# Patient Record
Sex: Female | Born: 1945
Health system: Southern US, Community
[De-identification: ages and names within clinical notes are randomized; demographics above are authoritative.]

## PROBLEM LIST (undated history)

## (undated) DIAGNOSIS — M8580 Other specified disorders of bone density and structure, unspecified site: Secondary | ICD-10-CM

## (undated) DIAGNOSIS — T7840XA Allergy, unspecified, initial encounter: Secondary | ICD-10-CM

## (undated) DIAGNOSIS — M1990 Unspecified osteoarthritis, unspecified site: Secondary | ICD-10-CM

## (undated) DIAGNOSIS — N952 Postmenopausal atrophic vaginitis: Secondary | ICD-10-CM

## (undated) DIAGNOSIS — D649 Anemia, unspecified: Secondary | ICD-10-CM

## (undated) HISTORY — DX: Anemia, unspecified: D64.9

## (undated) HISTORY — PX: TONSILLECTOMY AND ADENOIDECTOMY: SHX28

## (undated) HISTORY — PX: POLYPECTOMY: SHX149

## (undated) HISTORY — PX: BREAST SURGERY: SHX581

## (undated) HISTORY — DX: Other specified disorders of bone density and structure, unspecified site: M85.80

## (undated) HISTORY — PX: COLONOSCOPY: SHX174

## (undated) HISTORY — DX: Postmenopausal atrophic vaginitis: N95.2

## (undated) HISTORY — PX: WISDOM TOOTH EXTRACTION: SHX21

## (undated) HISTORY — DX: Allergy, unspecified, initial encounter: T78.40XA

## (undated) HISTORY — DX: Unspecified osteoarthritis, unspecified site: M19.90

---

## 1998-04-23 ENCOUNTER — Other Ambulatory Visit: Admission: RE | Admit: 1998-04-23 | Discharge: 1998-04-23 | Payer: Self-pay | Admitting: Obstetrics and Gynecology

## 1998-08-11 ENCOUNTER — Other Ambulatory Visit: Admission: RE | Admit: 1998-08-11 | Discharge: 1998-08-11 | Payer: Self-pay | Admitting: Obstetrics and Gynecology

## 1999-03-05 ENCOUNTER — Other Ambulatory Visit: Admission: RE | Admit: 1999-03-05 | Discharge: 1999-03-05 | Payer: Self-pay | Admitting: Obstetrics and Gynecology

## 2000-03-04 ENCOUNTER — Other Ambulatory Visit: Admission: RE | Admit: 2000-03-04 | Discharge: 2000-03-04 | Payer: Self-pay | Admitting: Obstetrics and Gynecology

## 2000-03-04 ENCOUNTER — Encounter (INDEPENDENT_AMBULATORY_CARE_PROVIDER_SITE_OTHER): Payer: Self-pay

## 2000-04-27 ENCOUNTER — Encounter: Payer: Self-pay | Admitting: Obstetrics and Gynecology

## 2000-04-27 ENCOUNTER — Encounter: Admission: RE | Admit: 2000-04-27 | Discharge: 2000-04-27 | Payer: Self-pay | Admitting: Obstetrics and Gynecology

## 2001-03-28 ENCOUNTER — Other Ambulatory Visit: Admission: RE | Admit: 2001-03-28 | Discharge: 2001-03-28 | Payer: Self-pay | Admitting: Obstetrics and Gynecology

## 2001-05-10 ENCOUNTER — Encounter: Admission: RE | Admit: 2001-05-10 | Discharge: 2001-05-10 | Payer: Self-pay

## 2001-05-10 ENCOUNTER — Encounter: Payer: Self-pay | Admitting: Obstetrics and Gynecology

## 2002-04-02 ENCOUNTER — Other Ambulatory Visit: Admission: RE | Admit: 2002-04-02 | Discharge: 2002-04-02 | Payer: Self-pay | Admitting: Obstetrics and Gynecology

## 2002-05-15 ENCOUNTER — Encounter: Admission: RE | Admit: 2002-05-15 | Discharge: 2002-05-15 | Payer: Self-pay | Admitting: Obstetrics and Gynecology

## 2002-05-15 ENCOUNTER — Encounter: Payer: Self-pay | Admitting: Obstetrics and Gynecology

## 2003-04-03 ENCOUNTER — Other Ambulatory Visit: Admission: RE | Admit: 2003-04-03 | Discharge: 2003-04-03 | Payer: Self-pay | Admitting: Obstetrics and Gynecology

## 2003-05-29 ENCOUNTER — Encounter: Admission: RE | Admit: 2003-05-29 | Discharge: 2003-05-29 | Payer: Self-pay | Admitting: Obstetrics and Gynecology

## 2003-05-29 ENCOUNTER — Encounter: Payer: Self-pay | Admitting: Obstetrics and Gynecology

## 2004-04-02 ENCOUNTER — Other Ambulatory Visit: Admission: RE | Admit: 2004-04-02 | Discharge: 2004-04-02 | Payer: Self-pay | Admitting: Obstetrics and Gynecology

## 2004-05-29 ENCOUNTER — Encounter: Admission: RE | Admit: 2004-05-29 | Discharge: 2004-05-29 | Payer: Self-pay | Admitting: Obstetrics and Gynecology

## 2005-04-06 ENCOUNTER — Other Ambulatory Visit: Admission: RE | Admit: 2005-04-06 | Discharge: 2005-04-06 | Payer: Self-pay | Admitting: Obstetrics and Gynecology

## 2005-06-07 ENCOUNTER — Encounter: Admission: RE | Admit: 2005-06-07 | Discharge: 2005-06-07 | Payer: Self-pay | Admitting: Obstetrics and Gynecology

## 2006-04-07 ENCOUNTER — Other Ambulatory Visit: Admission: RE | Admit: 2006-04-07 | Discharge: 2006-04-07 | Payer: Self-pay | Admitting: Obstetrics and Gynecology

## 2006-06-08 ENCOUNTER — Encounter: Admission: RE | Admit: 2006-06-08 | Discharge: 2006-06-08 | Payer: Self-pay | Admitting: Obstetrics and Gynecology

## 2007-04-25 ENCOUNTER — Other Ambulatory Visit: Admission: RE | Admit: 2007-04-25 | Discharge: 2007-04-25 | Payer: Self-pay | Admitting: Obstetrics and Gynecology

## 2007-06-20 ENCOUNTER — Encounter: Admission: RE | Admit: 2007-06-20 | Discharge: 2007-06-20 | Payer: Self-pay | Admitting: Obstetrics and Gynecology

## 2008-04-30 ENCOUNTER — Other Ambulatory Visit: Admission: RE | Admit: 2008-04-30 | Discharge: 2008-04-30 | Payer: Self-pay | Admitting: Obstetrics and Gynecology

## 2008-06-20 ENCOUNTER — Encounter: Admission: RE | Admit: 2008-06-20 | Discharge: 2008-06-20 | Payer: Self-pay | Admitting: Obstetrics and Gynecology

## 2009-05-01 ENCOUNTER — Encounter: Payer: Self-pay | Admitting: Obstetrics and Gynecology

## 2009-05-01 ENCOUNTER — Other Ambulatory Visit: Admission: RE | Admit: 2009-05-01 | Discharge: 2009-05-01 | Payer: Self-pay | Admitting: Obstetrics and Gynecology

## 2009-05-01 ENCOUNTER — Ambulatory Visit: Payer: Self-pay | Admitting: Obstetrics and Gynecology

## 2009-07-02 ENCOUNTER — Encounter: Admission: RE | Admit: 2009-07-02 | Discharge: 2009-07-02 | Payer: Self-pay | Admitting: Internal Medicine

## 2010-05-05 ENCOUNTER — Ambulatory Visit: Payer: Self-pay | Admitting: Obstetrics and Gynecology

## 2010-05-05 ENCOUNTER — Other Ambulatory Visit: Admission: RE | Admit: 2010-05-05 | Discharge: 2010-05-05 | Payer: Self-pay | Admitting: Obstetrics and Gynecology

## 2010-07-08 ENCOUNTER — Encounter: Admission: RE | Admit: 2010-07-08 | Discharge: 2010-07-08 | Payer: Self-pay | Admitting: Obstetrics and Gynecology

## 2010-07-21 ENCOUNTER — Encounter (INDEPENDENT_AMBULATORY_CARE_PROVIDER_SITE_OTHER): Payer: Self-pay | Admitting: *Deleted

## 2011-01-14 NOTE — Letter (Signed)
Summary: Colonoscopy Letter  [facility]  [address] [place], [place] [address]   Phone: [phone]  Fax: [phone]      July 21, 2010 MRN: [id number]   [address] [place], [place]  [address]   Dear Ms. [name],   According to your medical record, it is time for you to schedule a Colonoscopy. The American Cancer Society recommends this procedure as a method to detect early colon cancer. Patients with a family history of colon cancer, or a personal history of colon polyps or inflammatory bowel disease are at increased risk.  This letter has beeen generated based on the recommendations made at the time of your procedure. If you feel that in your particular situation this Marcos no longer apply, please contact our office.  Please call our office at [phone] to schedule this appointment or to update your records at your earliest convenience.  Thank you for cooperating with [place] to provide you with the very best care possible.   Sincerely,  Hedwig Morton. Juanda Chance, M.D.  [facility]ology Division [phone]

## 2011-04-07 ENCOUNTER — Other Ambulatory Visit: Payer: Self-pay | Admitting: Dermatology

## 2011-05-07 ENCOUNTER — Encounter (INDEPENDENT_AMBULATORY_CARE_PROVIDER_SITE_OTHER): Payer: Medicare Other | Admitting: Obstetrics and Gynecology

## 2011-05-07 DIAGNOSIS — N951 Menopausal and female climacteric states: Secondary | ICD-10-CM

## 2011-05-07 DIAGNOSIS — N952 Postmenopausal atrophic vaginitis: Secondary | ICD-10-CM

## 2011-05-07 DIAGNOSIS — M899 Disorder of bone, unspecified: Secondary | ICD-10-CM

## 2011-06-01 ENCOUNTER — Other Ambulatory Visit: Payer: Self-pay | Admitting: Obstetrics and Gynecology

## 2011-06-01 DIAGNOSIS — Z1231 Encounter for screening mammogram for malignant neoplasm of breast: Secondary | ICD-10-CM

## 2011-07-27 ENCOUNTER — Ambulatory Visit
Admission: RE | Admit: 2011-07-27 | Discharge: 2011-07-27 | Disposition: A | Discharge status: Home / Self Care | Payer: Medicare Other | Source: Ambulatory Visit | Attending: Obstetrics and Gynecology | Admitting: Obstetrics and Gynecology

## 2011-07-27 DIAGNOSIS — Z1231 Encounter for screening mammogram for malignant neoplasm of breast: Secondary | ICD-10-CM

## 2011-12-28 DIAGNOSIS — Z23 Encounter for immunization: Secondary | ICD-10-CM | POA: Diagnosis not present

## 2011-12-30 DIAGNOSIS — R29898 Other symptoms and signs involving the musculoskeletal system: Secondary | ICD-10-CM | POA: Diagnosis not present

## 2011-12-30 DIAGNOSIS — M25569 Pain in unspecified knee: Secondary | ICD-10-CM | POA: Diagnosis not present

## 2012-01-08 DIAGNOSIS — L0202 Furuncle of face: Secondary | ICD-10-CM | POA: Diagnosis not present

## 2012-01-08 DIAGNOSIS — L0203 Carbuncle of face: Secondary | ICD-10-CM | POA: Diagnosis not present

## 2012-01-26 DIAGNOSIS — H6120 Impacted cerumen, unspecified ear: Secondary | ICD-10-CM | POA: Diagnosis not present

## 2012-06-06 ENCOUNTER — Other Ambulatory Visit: Payer: Self-pay | Admitting: Obstetrics and Gynecology

## 2012-06-07 ENCOUNTER — Other Ambulatory Visit: Payer: Self-pay | Admitting: Obstetrics and Gynecology

## 2012-06-07 DIAGNOSIS — Z1231 Encounter for screening mammogram for malignant neoplasm of breast: Secondary | ICD-10-CM

## 2012-06-19 ENCOUNTER — Encounter: Payer: Self-pay | Admitting: Obstetrics and Gynecology

## 2012-06-19 ENCOUNTER — Ambulatory Visit (INDEPENDENT_AMBULATORY_CARE_PROVIDER_SITE_OTHER): Payer: Medicare Other | Admitting: Obstetrics and Gynecology

## 2012-06-19 VITALS — BP 116/74 | Ht 64.5 in | Wt 102.0 lb

## 2012-06-19 DIAGNOSIS — N952 Postmenopausal atrophic vaginitis: Secondary | ICD-10-CM | POA: Insufficient documentation

## 2012-06-19 DIAGNOSIS — Z780 Asymptomatic menopausal state: Secondary | ICD-10-CM

## 2012-06-19 DIAGNOSIS — M8580 Other specified disorders of bone density and structure, unspecified site: Secondary | ICD-10-CM | POA: Insufficient documentation

## 2012-06-19 DIAGNOSIS — M899 Disorder of bone, unspecified: Secondary | ICD-10-CM

## 2012-06-19 MED ORDER — RALOXIFENE HCL 60 MG PO TABS: 60.0000 mg | ORAL_TABLET | Freq: Every day | ORAL | Status: AC

## 2012-06-19 MED ORDER — ESTRADIOL 10 MCG VA TABS: ORAL_TABLET | VAGINAL | Status: DC

## 2012-06-19 NOTE — Progress Notes (Signed)
Patient came to see me today for followup. We have her on Evista both for bone protection with low bone mass and breast cancer protection with the mother who had breast cancer. She does well on Evista without side effects. She also takes Vagifem for vaginal dryness with good results when she remembers it. Her last bone density which is done by her PCP showed improvement. She is having no vaginal bleeding. She is having no pelvic pain. She is not having dysuria, urgency, frequency or incontinence of urination. She will have occasional menopausal symptoms which are very tolerable. She has never had an abnormal Pap smear due  to cervical dysplasia. Her last Pap smear was [place]frane, 2011.  ROS: 12 system review done. Pertinent positives above.  Physical examination:Kim Julian Reil present.HEENT within normal limits. Neck: Thyroid not large. No masses. Supraclavicular nodes: not enlarged. Breasts: Examined in both sitting and lying  position. No skin changes and no masses. Abdomen: Soft no guarding rebound or masses or hernia. Pelvic: External: Within normal limits. BUS: Within normal limits. Vaginal:within normal limits. Good estrogen effect. No evidence of cystocele rectocele or enterocele. Cervix: clean. Uterus: Normal size and shape. Adnexa: No masses. Rectovaginal exam: Confirmatory and negative. Extremities: Within normal limits.  Assessment: #1. Osteopenia #2. Atrophic vaginitis #3. Mild menopausal symptoms #4. Mother with breast cancer  Plan: Continue yearly mammograms. Continue Evista. Continue Vagifem.The new Pap smear guidelines were discussed with the patient. No pap done.

## 2012-07-17 DIAGNOSIS — E079 Disorder of thyroid, unspecified: Secondary | ICD-10-CM | POA: Diagnosis not present

## 2012-07-17 DIAGNOSIS — M949 Disorder of cartilage, unspecified: Secondary | ICD-10-CM | POA: Diagnosis not present

## 2012-07-17 DIAGNOSIS — M899 Disorder of bone, unspecified: Secondary | ICD-10-CM | POA: Diagnosis not present

## 2012-07-17 DIAGNOSIS — R7301 Impaired fasting glucose: Secondary | ICD-10-CM | POA: Diagnosis not present

## 2012-07-20 ENCOUNTER — Encounter: Payer: Medicare Other | Admitting: Obstetrics and Gynecology

## 2012-07-21 DIAGNOSIS — R7301 Impaired fasting glucose: Secondary | ICD-10-CM | POA: Diagnosis not present

## 2012-07-21 DIAGNOSIS — M899 Disorder of bone, unspecified: Secondary | ICD-10-CM | POA: Diagnosis not present

## 2012-07-21 DIAGNOSIS — E079 Disorder of thyroid, unspecified: Secondary | ICD-10-CM | POA: Diagnosis not present

## 2012-07-21 DIAGNOSIS — M949 Disorder of cartilage, unspecified: Secondary | ICD-10-CM | POA: Diagnosis not present

## 2012-07-21 DIAGNOSIS — Z0000 Encounter for general adult medical examination without abnormal findings: Secondary | ICD-10-CM | POA: Diagnosis not present

## 2012-08-03 ENCOUNTER — Ambulatory Visit
Admission: RE | Admit: 2012-08-03 | Discharge: 2012-08-03 | Disposition: A | Discharge status: Home / Self Care | Payer: Medicare Other | Source: Ambulatory Visit | Attending: Obstetrics and Gynecology | Admitting: Obstetrics and Gynecology

## 2012-08-03 DIAGNOSIS — Z1231 Encounter for screening mammogram for malignant neoplasm of breast: Secondary | ICD-10-CM

## 2012-08-08 ENCOUNTER — Ambulatory Visit: Payer: Medicare Other

## 2012-08-15 ENCOUNTER — Ambulatory Visit: Payer: Medicare Other

## 2012-08-15 ENCOUNTER — Encounter: Payer: Self-pay | Admitting: Internal Medicine

## 2012-10-10 DIAGNOSIS — Z23 Encounter for immunization: Secondary | ICD-10-CM | POA: Diagnosis not present

## 2012-12-22 ENCOUNTER — Other Ambulatory Visit: Payer: Self-pay | Admitting: Dermatology

## 2012-12-22 DIAGNOSIS — D485 Neoplasm of uncertain behavior of skin: Secondary | ICD-10-CM | POA: Diagnosis not present

## 2012-12-22 DIAGNOSIS — D1801 Hemangioma of skin and subcutaneous tissue: Secondary | ICD-10-CM | POA: Diagnosis not present

## 2012-12-22 DIAGNOSIS — C44711 Basal cell carcinoma of skin of unspecified lower limb, including hip: Secondary | ICD-10-CM | POA: Diagnosis not present

## 2012-12-22 DIAGNOSIS — Z85828 Personal history of other malignant neoplasm of skin: Secondary | ICD-10-CM | POA: Diagnosis not present

## 2012-12-22 DIAGNOSIS — L820 Inflamed seborrheic keratosis: Secondary | ICD-10-CM | POA: Diagnosis not present

## 2012-12-22 DIAGNOSIS — L819 Disorder of pigmentation, unspecified: Secondary | ICD-10-CM | POA: Diagnosis not present

## 2012-12-22 DIAGNOSIS — L570 Actinic keratosis: Secondary | ICD-10-CM | POA: Diagnosis not present

## 2012-12-22 DIAGNOSIS — L821 Other seborrheic keratosis: Secondary | ICD-10-CM | POA: Diagnosis not present

## 2013-01-15 DIAGNOSIS — E079 Disorder of thyroid, unspecified: Secondary | ICD-10-CM | POA: Diagnosis not present

## 2013-05-16 DIAGNOSIS — N3090 Cystitis, unspecified without hematuria: Secondary | ICD-10-CM | POA: Diagnosis not present

## 2013-05-16 DIAGNOSIS — R82998 Other abnormal findings in urine: Secondary | ICD-10-CM | POA: Diagnosis not present

## 2013-06-25 ENCOUNTER — Other Ambulatory Visit: Payer: Self-pay

## 2013-06-25 DIAGNOSIS — Z1231 Encounter for screening mammogram for malignant neoplasm of breast: Secondary | ICD-10-CM

## 2013-06-29 DIAGNOSIS — Z01419 Encounter for gynecological examination (general) (routine) without abnormal findings: Secondary | ICD-10-CM | POA: Diagnosis not present

## 2013-06-29 DIAGNOSIS — Z124 Encounter for screening for malignant neoplasm of cervix: Secondary | ICD-10-CM | POA: Diagnosis not present

## 2013-08-29 ENCOUNTER — Ambulatory Visit
Admission: RE | Admit: 2013-08-29 | Discharge: 2013-08-29 | Disposition: A | Discharge status: Home / Self Care | Payer: Medicare Other | Source: Ambulatory Visit

## 2013-08-29 DIAGNOSIS — Z1231 Encounter for screening mammogram for malignant neoplasm of breast: Secondary | ICD-10-CM

## 2013-09-03 ENCOUNTER — Other Ambulatory Visit: Payer: Self-pay | Admitting: Internal Medicine

## 2013-09-03 DIAGNOSIS — R928 Other abnormal and inconclusive findings on diagnostic imaging of breast: Secondary | ICD-10-CM

## 2013-09-05 DIAGNOSIS — Z79899 Other long term (current) drug therapy: Secondary | ICD-10-CM | POA: Diagnosis not present

## 2013-09-05 DIAGNOSIS — R7301 Impaired fasting glucose: Secondary | ICD-10-CM | POA: Diagnosis not present

## 2013-09-05 DIAGNOSIS — M899 Disorder of bone, unspecified: Secondary | ICD-10-CM | POA: Diagnosis not present

## 2013-09-05 DIAGNOSIS — E079 Disorder of thyroid, unspecified: Secondary | ICD-10-CM | POA: Diagnosis not present

## 2013-09-06 DIAGNOSIS — Z23 Encounter for immunization: Secondary | ICD-10-CM | POA: Diagnosis not present

## 2013-09-12 DIAGNOSIS — R7301 Impaired fasting glucose: Secondary | ICD-10-CM | POA: Diagnosis not present

## 2013-09-12 DIAGNOSIS — Z23 Encounter for immunization: Secondary | ICD-10-CM | POA: Diagnosis not present

## 2013-09-12 DIAGNOSIS — Z1331 Encounter for screening for depression: Secondary | ICD-10-CM | POA: Diagnosis not present

## 2013-09-12 DIAGNOSIS — Z681 Body mass index (BMI) 19 or less, adult: Secondary | ICD-10-CM | POA: Diagnosis not present

## 2013-09-12 DIAGNOSIS — Z0000 Encounter for general adult medical examination without abnormal findings: Secondary | ICD-10-CM | POA: Diagnosis not present

## 2013-09-12 DIAGNOSIS — M899 Disorder of bone, unspecified: Secondary | ICD-10-CM | POA: Diagnosis not present

## 2013-09-12 DIAGNOSIS — E079 Disorder of thyroid, unspecified: Secondary | ICD-10-CM | POA: Diagnosis not present

## 2013-09-19 ENCOUNTER — Ambulatory Visit
Admission: RE | Admit: 2013-09-19 | Discharge: 2013-09-19 | Disposition: A | Discharge status: Home / Self Care | Payer: Medicare Other | Source: Ambulatory Visit | Attending: Internal Medicine | Admitting: Internal Medicine

## 2013-09-19 ENCOUNTER — Other Ambulatory Visit: Payer: Self-pay | Admitting: Internal Medicine

## 2013-09-19 DIAGNOSIS — R928 Other abnormal and inconclusive findings on diagnostic imaging of breast: Secondary | ICD-10-CM

## 2013-10-09 ENCOUNTER — Encounter: Payer: Self-pay | Admitting: Internal Medicine

## 2013-10-10 DIAGNOSIS — M899 Disorder of bone, unspecified: Secondary | ICD-10-CM | POA: Diagnosis not present

## 2013-12-17 ENCOUNTER — Ambulatory Visit (AMBULATORY_SURGERY_CENTER): Payer: Self-pay | Admitting: *Deleted

## 2013-12-17 VITALS — Ht 64.5 in | Wt 107.8 lb

## 2013-12-17 DIAGNOSIS — Z860100 Personal history of colon polyps, unspecified: Secondary | ICD-10-CM

## 2013-12-17 MED ORDER — MOVIPREP 100 G PO SOLR: 1.0000 | Freq: Once | ORAL | Status: DC

## 2013-12-17 NOTE — Progress Notes (Signed)
No egg or soy allergy. No anesthesia problems.  

## 2013-12-25 ENCOUNTER — Ambulatory Visit (AMBULATORY_SURGERY_CENTER): Payer: Medicare Other | Admitting: Internal Medicine

## 2013-12-25 ENCOUNTER — Encounter: Payer: Self-pay | Admitting: Internal Medicine

## 2013-12-25 VITALS — BP 123/67 | HR 50 | Temp 96.8°F | Resp 33 | Ht 64.0 in | Wt 107.0 lb

## 2013-12-25 DIAGNOSIS — F411 Generalized anxiety disorder: Secondary | ICD-10-CM | POA: Diagnosis not present

## 2013-12-25 DIAGNOSIS — D126 Benign neoplasm of colon, unspecified: Secondary | ICD-10-CM

## 2013-12-25 DIAGNOSIS — Z86010 Personal history of colonic polyps: Secondary | ICD-10-CM

## 2013-12-25 DIAGNOSIS — Z1211 Encounter for screening for malignant neoplasm of colon: Secondary | ICD-10-CM

## 2013-12-25 MED ORDER — SODIUM CHLORIDE 0.9 % IV SOLN: 500.0000 mL | INTRAVENOUS | Status: DC

## 2013-12-25 NOTE — Op Note (Signed)
[place]  [address] [place], [address]   COLONOSCOPY PROCEDURE REPORT  PATIENT: [name], Madison M.  MR#: [id number] BIRTHDATE: [birth year] , 65  yrs. old GENDER: Female ENDOSCOPIST: Lafayette Dragon, MD REFERRED EN:IDPO Perini, M.D. PROCEDURE DATE:  12/25/2013 PROCEDURE:   Colonoscopy with cold biopsy polypectomy First Screening Colonoscopy - Avg.  risk and is 50 yrs.  old or older - No.  Prior Negative Screening - Now for repeat screening. N/A  History of Adenoma - Now for follow-up colonoscopy & has been > or = to 3 yrs.  N/A  Polyps Removed Today? Yes. ASA CLASS:   Class II INDICATIONS :hyper plastic polyp x2 on last colonoscopy in September 2004. MEDICATIONS: MAC sedation, administered by CRNA and propofol (Diprivan) 350mg  IV  DESCRIPTION OF PROCEDURE:   After the risks benefits and alternatives of the procedure were thoroughly explained, informed consent was obtained.  A digital rectal exam revealed no abnormalities of the rectum.   The LB PCF Q180 [id number]  endoscope was introduced through the anus and advanced to the cecum, which was identified by both the appendix and ileocecal valve. No adverse events experienced.   The quality of the prep was excellent, using MoviPrep  The instrument was then slowly withdrawn as the colon was fully examined.      COLON FINDINGS: A smooth sessile polyp ranging between 3-59mm in size was found in the ascending colon at 90 cm another  small polyp vs fold was located in the vicinity, and was removed with cold biopsies.   A polypectomy was performed with cold forceps.  The resection was complete and the polyp tissue was completely retrieved. Colon was tortuous Retroflexed views revealed no abnormalities. The time to cecum=22 minutes 14 seconds.  Withdrawal time=6 minutes 05 seconds.  The scope was withdrawn and the procedure completed. COMPLICATIONS: There were no complications.  ENDOSCOPIC IMPRESSION: Sessile polyp  ranging between 3-22mm in size was found in the ascending colon;at 90 cm, polypectomy was performed with cold forceps another small polyp versus fold was removed from 90 cm tortuous colon  RECOMMENDATIONS: 1.  Await biopsy results 2.  high fiber diet Equal colonoscopy pending pathology   eSigned:  Lafayette Dragon, MD 12/25/2013 10:32 AM   cc:   PATIENT NAME:  [name], Madison M. MR#: [id number]

## 2013-12-25 NOTE — Patient Instructions (Signed)
YOU HAD AN ENDOSCOPIC PROCEDURE TODAY AT THE [facility]: Refer to the procedure report that was given to you for any specific questions about what was found during the examination.  If the procedure report does not answer your questions, please call your gastroenterologist to clarify.  If you requested that your care partner not be given the details of your procedure findings, then the procedure report has been included in a sealed envelope for you to review at your convenience later.  YOU SHOULD EXPECT: Some feelings of bloating in the abdomen. Passage of more gas than usual.  Walking can help get rid of the air that was put into your GI tract during the procedure and reduce the bloating. If you had a lower endoscopy (such as a colonoscopy or flexible sigmoidoscopy) you Crevier notice spotting of blood in your stool or on the toilet paper. If you underwent a bowel prep for your procedure, then you Jacquez not have a normal bowel movement for a few days.  DIET: Your first meal following the procedure should be a light meal and then it is ok to progress to your normal diet.  A half-sandwich or bowl of soup is an example of a good first meal.  Heavy or fried foods are harder to digest and Haskew make you feel nauseous or bloated.  Likewise meals heavy in dairy and vegetables can cause extra gas to form and this can also increase the bloating.  Drink plenty of fluids but you should avoid alcoholic beverages for 24 hours.  ACTIVITY: Your care partner should take you home directly after the procedure.  You should plan to take it easy, moving slowly for the rest of the day.  You can resume normal activity the day after the procedure however you should NOT DRIVE or use heavy machinery for 24 hours (because of the sedation medicines used during the test).    SYMPTOMS TO REPORT IMMEDIATELY: A gastroenterologist can be reached at any hour.  During normal business hours, 8:30 AM to 5:00 PM Monday through Friday,  call [phone].  After hours and on weekends, please call the GI answering service at [phone] who will take a message and have the physician on call contact you.   Following lower endoscopy (colonoscopy or flexible sigmoidoscopy):  Excessive amounts of blood in the stool  Significant tenderness or worsening of abdominal pains  Swelling of the abdomen that is new, acute  Fever of 100F or higher  FOLLOW UP: If any biopsies were taken you will be contacted by phone or by letter within the next 1-3 weeks.  Call your gastroenterologist if you have not heard about the biopsies in 3 weeks.  Our staff will call the home number listed on your records the next business day following your procedure to check on you and address any questions or concerns that you Mederos have at that time regarding the information given to you following your procedure. This is a courtesy call and so if there is no answer at the home number and we have not heard from you through the emergency physician on call, we will assume that you have returned to your regular daily activities without incident.  SIGNATURES/CONFIDENTIALITY: You and/or your care partner have signed paperwork which will be entered into your electronic medical record.  These signatures attest to the fact that that the information above on your After Visit Summary has been reviewed and is understood.  Full responsibility of the confidentiality of this   discharge information lies with you and/or your care-partner.   Handouts on polyps and high fiber diet

## 2013-12-25 NOTE — Progress Notes (Signed)
A/ox3 pleased with MAC, report to Marie RN 

## 2013-12-25 NOTE — Progress Notes (Signed)
Called to room to assist during endoscopic procedure.  Patient ID and intended procedure confirmed with present staff. Received instructions for my participation in the procedure from the performing physician.  

## 2013-12-26 ENCOUNTER — Telehealth: Payer: Self-pay | Admitting: *Deleted

## 2013-12-26 NOTE — Telephone Encounter (Signed)
  Follow up Call-  Call back number 12/25/2013  Post procedure Call Back phone  # [phone]  Permission to leave phone message Yes     Patient questions:  Do you have a fever, pain , or abdominal swelling? no Pain Score  0 *  Have you tolerated food without any problems? yes  Have you been able to return to your normal activities? yes  Do you have any questions about your discharge instructions: Diet   no Medications  no Follow up visit  no  Do you have questions or concerns about your Care? no  Actions: * If pain score is 4 or above: No action needed, pain <4.

## 2013-12-31 ENCOUNTER — Encounter: Payer: Self-pay | Admitting: Internal Medicine

## 2014-01-02 ENCOUNTER — Encounter: Payer: Self-pay | Admitting: *Deleted

## 2014-01-02 DIAGNOSIS — L819 Disorder of pigmentation, unspecified: Secondary | ICD-10-CM | POA: Diagnosis not present

## 2014-01-02 DIAGNOSIS — L821 Other seborrheic keratosis: Secondary | ICD-10-CM | POA: Diagnosis not present

## 2014-01-02 DIAGNOSIS — L259 Unspecified contact dermatitis, unspecified cause: Secondary | ICD-10-CM | POA: Diagnosis not present

## 2014-01-02 DIAGNOSIS — L723 Sebaceous cyst: Secondary | ICD-10-CM | POA: Diagnosis not present

## 2014-01-02 DIAGNOSIS — Z85828 Personal history of other malignant neoplasm of skin: Secondary | ICD-10-CM | POA: Diagnosis not present

## 2014-04-23 DIAGNOSIS — L918 Other hypertrophic disorders of the skin: Secondary | ICD-10-CM | POA: Diagnosis not present

## 2014-04-23 DIAGNOSIS — L570 Actinic keratosis: Secondary | ICD-10-CM | POA: Diagnosis not present

## 2014-04-23 DIAGNOSIS — L819 Disorder of pigmentation, unspecified: Secondary | ICD-10-CM | POA: Diagnosis not present

## 2014-04-23 DIAGNOSIS — Z419 Encounter for procedure for purposes other than remedying health state, unspecified: Secondary | ICD-10-CM | POA: Diagnosis not present

## 2014-04-23 DIAGNOSIS — L908 Other atrophic disorders of skin: Secondary | ICD-10-CM | POA: Diagnosis not present

## 2014-04-23 DIAGNOSIS — Z85828 Personal history of other malignant neoplasm of skin: Secondary | ICD-10-CM | POA: Diagnosis not present

## 2014-08-02 DIAGNOSIS — Z01419 Encounter for gynecological examination (general) (routine) without abnormal findings: Secondary | ICD-10-CM | POA: Diagnosis not present

## 2014-08-02 DIAGNOSIS — Z124 Encounter for screening for malignant neoplasm of cervix: Secondary | ICD-10-CM | POA: Diagnosis not present

## 2014-08-13 ENCOUNTER — Other Ambulatory Visit: Payer: Self-pay

## 2014-08-13 DIAGNOSIS — Z1231 Encounter for screening mammogram for malignant neoplasm of breast: Secondary | ICD-10-CM

## 2014-09-05 ENCOUNTER — Ambulatory Visit
Admission: RE | Admit: 2014-09-05 | Discharge: 2014-09-05 | Disposition: A | Discharge status: Home / Self Care | Payer: Medicare Other | Source: Ambulatory Visit

## 2014-09-05 ENCOUNTER — Encounter (INDEPENDENT_AMBULATORY_CARE_PROVIDER_SITE_OTHER): Payer: Self-pay

## 2014-09-05 DIAGNOSIS — Z1231 Encounter for screening mammogram for malignant neoplasm of breast: Secondary | ICD-10-CM

## 2014-10-01 DIAGNOSIS — M859 Disorder of bone density and structure, unspecified: Secondary | ICD-10-CM | POA: Diagnosis not present

## 2014-10-01 DIAGNOSIS — Z0000 Encounter for general adult medical examination without abnormal findings: Secondary | ICD-10-CM | POA: Diagnosis not present

## 2014-10-01 DIAGNOSIS — M8588 Other specified disorders of bone density and structure, other site: Secondary | ICD-10-CM | POA: Diagnosis not present

## 2014-10-01 DIAGNOSIS — R946 Abnormal results of thyroid function studies: Secondary | ICD-10-CM | POA: Diagnosis not present

## 2014-10-01 DIAGNOSIS — R7301 Impaired fasting glucose: Secondary | ICD-10-CM | POA: Diagnosis not present

## 2014-10-01 DIAGNOSIS — M8580 Other specified disorders of bone density and structure, unspecified site: Secondary | ICD-10-CM | POA: Diagnosis not present

## 2014-10-08 DIAGNOSIS — R7301 Impaired fasting glucose: Secondary | ICD-10-CM | POA: Diagnosis not present

## 2014-10-08 DIAGNOSIS — S2231XA Fracture of one rib, right side, initial encounter for closed fracture: Secondary | ICD-10-CM | POA: Diagnosis not present

## 2014-10-08 DIAGNOSIS — H9190 Unspecified hearing loss, unspecified ear: Secondary | ICD-10-CM | POA: Diagnosis not present

## 2014-10-08 DIAGNOSIS — Z1389 Encounter for screening for other disorder: Secondary | ICD-10-CM | POA: Diagnosis not present

## 2014-10-08 DIAGNOSIS — M8580 Other specified disorders of bone density and structure, unspecified site: Secondary | ICD-10-CM | POA: Diagnosis not present

## 2014-10-08 DIAGNOSIS — Z0000 Encounter for general adult medical examination without abnormal findings: Secondary | ICD-10-CM | POA: Diagnosis not present

## 2014-10-08 DIAGNOSIS — R946 Abnormal results of thyroid function studies: Secondary | ICD-10-CM | POA: Diagnosis not present

## 2014-10-08 DIAGNOSIS — Z23 Encounter for immunization: Secondary | ICD-10-CM | POA: Diagnosis not present

## 2014-10-14 ENCOUNTER — Encounter: Payer: Self-pay | Admitting: Internal Medicine

## 2014-10-29 DIAGNOSIS — L821 Other seborrheic keratosis: Secondary | ICD-10-CM | POA: Diagnosis not present

## 2014-10-29 DIAGNOSIS — L814 Other melanin hyperpigmentation: Secondary | ICD-10-CM | POA: Diagnosis not present

## 2014-10-29 DIAGNOSIS — Z85828 Personal history of other malignant neoplasm of skin: Secondary | ICD-10-CM | POA: Diagnosis not present

## 2014-10-29 DIAGNOSIS — L820 Inflamed seborrheic keratosis: Secondary | ICD-10-CM | POA: Diagnosis not present

## 2014-10-29 DIAGNOSIS — D225 Melanocytic nevi of trunk: Secondary | ICD-10-CM | POA: Diagnosis not present

## 2015-02-18 ENCOUNTER — Other Ambulatory Visit: Payer: Self-pay | Admitting: Dermatology

## 2015-02-18 DIAGNOSIS — C4442 Squamous cell carcinoma of skin of scalp and neck: Secondary | ICD-10-CM | POA: Diagnosis not present

## 2015-02-18 DIAGNOSIS — C44612 Basal cell carcinoma of skin of right upper limb, including shoulder: Secondary | ICD-10-CM | POA: Diagnosis not present

## 2015-02-18 DIAGNOSIS — L570 Actinic keratosis: Secondary | ICD-10-CM | POA: Diagnosis not present

## 2015-02-18 DIAGNOSIS — C44529 Squamous cell carcinoma of skin of other part of trunk: Secondary | ICD-10-CM | POA: Diagnosis not present

## 2015-02-18 DIAGNOSIS — Z85828 Personal history of other malignant neoplasm of skin: Secondary | ICD-10-CM | POA: Diagnosis not present

## 2015-04-10 DIAGNOSIS — J302 Other seasonal allergic rhinitis: Secondary | ICD-10-CM | POA: Diagnosis not present

## 2015-04-10 DIAGNOSIS — Z681 Body mass index (BMI) 19 or less, adult: Secondary | ICD-10-CM | POA: Diagnosis not present

## 2015-08-04 ENCOUNTER — Other Ambulatory Visit: Payer: Self-pay

## 2015-08-04 DIAGNOSIS — Z1231 Encounter for screening mammogram for malignant neoplasm of breast: Secondary | ICD-10-CM

## 2015-09-08 ENCOUNTER — Ambulatory Visit
Admission: RE | Admit: 2015-09-08 | Discharge: 2015-09-08 | Disposition: A | Discharge status: Home / Self Care | Payer: Medicare Other | Source: Ambulatory Visit

## 2015-09-08 DIAGNOSIS — Z1231 Encounter for screening mammogram for malignant neoplasm of breast: Secondary | ICD-10-CM

## 2015-10-07 DIAGNOSIS — R7301 Impaired fasting glucose: Secondary | ICD-10-CM | POA: Diagnosis not present

## 2015-10-07 DIAGNOSIS — Z0000 Encounter for general adult medical examination without abnormal findings: Secondary | ICD-10-CM | POA: Diagnosis not present

## 2015-10-07 DIAGNOSIS — R946 Abnormal results of thyroid function studies: Secondary | ICD-10-CM | POA: Diagnosis not present

## 2015-10-07 DIAGNOSIS — M859 Disorder of bone density and structure, unspecified: Secondary | ICD-10-CM | POA: Diagnosis not present

## 2015-11-05 DIAGNOSIS — M859 Disorder of bone density and structure, unspecified: Secondary | ICD-10-CM | POA: Diagnosis not present

## 2015-11-05 DIAGNOSIS — M25569 Pain in unspecified knee: Secondary | ICD-10-CM | POA: Diagnosis not present

## 2015-11-05 DIAGNOSIS — Z23 Encounter for immunization: Secondary | ICD-10-CM | POA: Diagnosis not present

## 2015-11-05 DIAGNOSIS — R946 Abnormal results of thyroid function studies: Secondary | ICD-10-CM | POA: Diagnosis not present

## 2015-11-05 DIAGNOSIS — Z0000 Encounter for general adult medical examination without abnormal findings: Secondary | ICD-10-CM | POA: Diagnosis not present

## 2015-11-05 DIAGNOSIS — J302 Other seasonal allergic rhinitis: Secondary | ICD-10-CM | POA: Diagnosis not present

## 2015-11-05 DIAGNOSIS — H9190 Unspecified hearing loss, unspecified ear: Secondary | ICD-10-CM | POA: Diagnosis not present

## 2015-11-05 DIAGNOSIS — Z681 Body mass index (BMI) 19 or less, adult: Secondary | ICD-10-CM | POA: Diagnosis not present

## 2015-11-05 DIAGNOSIS — Z1389 Encounter for screening for other disorder: Secondary | ICD-10-CM | POA: Diagnosis not present

## 2015-11-05 DIAGNOSIS — R7301 Impaired fasting glucose: Secondary | ICD-10-CM | POA: Diagnosis not present

## 2015-11-11 DIAGNOSIS — L821 Other seborrheic keratosis: Secondary | ICD-10-CM | POA: Diagnosis not present

## 2015-11-11 DIAGNOSIS — B078 Other viral warts: Secondary | ICD-10-CM | POA: Diagnosis not present

## 2015-11-11 DIAGNOSIS — I788 Other diseases of capillaries: Secondary | ICD-10-CM | POA: Diagnosis not present

## 2015-11-11 DIAGNOSIS — L570 Actinic keratosis: Secondary | ICD-10-CM | POA: Diagnosis not present

## 2015-11-11 DIAGNOSIS — L853 Xerosis cutis: Secondary | ICD-10-CM | POA: Diagnosis not present

## 2015-11-11 DIAGNOSIS — Z85828 Personal history of other malignant neoplasm of skin: Secondary | ICD-10-CM | POA: Diagnosis not present

## 2015-12-22 ENCOUNTER — Ambulatory Visit (INDEPENDENT_AMBULATORY_CARE_PROVIDER_SITE_OTHER): Payer: Medicare Other | Admitting: Gynecology

## 2015-12-22 ENCOUNTER — Telehealth: Payer: Self-pay

## 2015-12-22 ENCOUNTER — Encounter: Payer: Self-pay | Admitting: Gynecology

## 2015-12-22 VITALS — BP 118/76 | Ht 64.75 in | Wt 108.0 lb

## 2015-12-22 DIAGNOSIS — N952 Postmenopausal atrophic vaginitis: Secondary | ICD-10-CM | POA: Diagnosis not present

## 2015-12-22 DIAGNOSIS — Z01419 Encounter for gynecological examination (general) (routine) without abnormal findings: Secondary | ICD-10-CM

## 2015-12-22 DIAGNOSIS — M8580 Other specified disorders of bone density and structure, unspecified site: Secondary | ICD-10-CM | POA: Diagnosis not present

## 2015-12-22 NOTE — Telephone Encounter (Signed)
Patient called. Informed. 

## 2015-12-22 NOTE — Progress Notes (Signed)
Madison [name] [birth year] [id number]   History:    70 y.o.  for annual gyn exam who has not been seen in the practice since 2013. Patient has been on Evista 60 mg daily that her previous gynecologist her on for bone protection and with history of low bone mass (osteopenia) as well as for breast cancer protection since her mother had breast cancer. She reports no like cramps or any side effects. Her PCP has been Dr. Elta Guadeloupe Pirrini who is been doing her blood work and has been doing her bone density studies as well. Patient stated she had a colonoscopy in 2015 but does have a history of benign colon polyps and is on a 5 year recall. She is no longer using Vagifem for vaginal atrophy.She is having no vaginal bleeding. She is having no pelvic pain. She is not having dysuria, urgency, frequency or incontinence of urination. Patient with no past history of any abnormal Pap smears in the past  Past medical history,surgical history, family history and social history were all reviewed and documented in the EPIC chart.  Gynecologic History No LMP recorded. Patient is postmenopausal. Contraception: post menopausal status Last Pap: 2011. Results were: normal Last mammogram: 2016. Results were: normal  Obstetric History OB History  Gravida Para Term Preterm AB SAB TAB Ectopic Multiple Living  2 2 2       2     # Outcome Date GA Lbr Len/2nd Weight Sex Delivery Anes PTL Lv  2 Term           1 Term                ROS: A ROS was performed and pertinent positives and negatives are included in the history.  GENERAL: No fevers or chills. HEENT: No change in vision, no earache, sore throat or sinus congestion. NECK: No pain or stiffness. CARDIOVASCULAR: No chest pain or pressure. No palpitations. PULMONARY: No shortness of breath, cough or wheeze. GASTROINTESTINAL: No abdominal pain, nausea, vomiting or diarrhea, melena or bright red blood per rectum. GENITOURINARY: No urinary frequency, urgency, hesitancy or  dysuria. MUSCULOSKELETAL: No joint or muscle pain, no back pain, no recent trauma. DERMATOLOGIC: No rash, no itching, no lesions. ENDOCRINE: No polyuria, polydipsia, no heat or cold intolerance. No recent change in weight. HEMATOLOGICAL: No anemia or easy bruising or bleeding. NEUROLOGIC: No headache, seizures, numbness, tingling or weakness. PSYCHIATRIC: No depression, no loss of interest in normal activity or change in sleep pattern.     Exam: chaperone present  BP 118/76 mmHg  Ht 5' 4.75" (1.645 m)  Wt 108 lb (48.988 kg)  BMI 18.10 kg/m2  Body mass index is 18.1 kg/(m^2).  General appearance : Well developed well nourished female. No acute distress HEENT: Eyes: no retinal hemorrhage or exudates,  Neck supple, trachea midline, no carotid bruits, no thyroidmegaly Lungs: Clear to auscultation, no rhonchi or wheezes, or rib retractions  Heart: Regular rate and rhythm, no murmurs or gallops Breast:Examined in sitting and supine position were symmetrical in appearance, no palpable masses or tenderness,  no skin retraction, no nipple inversion, no nipple discharge, no skin discoloration, no axillary or supraclavicular lymphadenopathy Abdomen: no palpable masses or tenderness, no rebound or guarding Extremities: no edema or skin discoloration or tenderness  Pelvic:  Bartholin, Urethra, Skene Glands: Within normal limits             Vagina: No gross lesions or discharge, atrophic changes  Cervix: No gross lesions or discharge  Uterus  axial, normal size, shape and consistency, non-tender and mobile  Adnexa  Without masses or tenderness  Anus and perineum  normal   Rectovaginal  normal sphincter tone without palpated masses or tenderness             Hemoccult PCP provides     Assessment/Plan:  70 y.o. female for annual exam who is postmenopausal on no hormone replacement therapy. History of osteopenia, low body mass index, Caucasian, postmenopausal on Evista 60 mg daily and has done well.  Pap smear no longer indicated according to the new guidelines. Patient to sign a medical release so that we can have copy of her last bone density study in order to of date our records. Patient is taking vitamin D 2000 units daily and is a very active lifestyle. All her vaccines are up-to-date.   Terrance Mass MD, 3:02 PM 12/22/2015

## 2015-12-22 NOTE — Telephone Encounter (Signed)
Terrance Mass, MD  Ramond Craver, RMA            Juliann Pulse, please inform patient that after she left I went through her old records and found that her blood type is B+ since she wanted to know.     I called patient and left message home recorder to call me.

## 2016-08-09 ENCOUNTER — Other Ambulatory Visit: Payer: Self-pay | Admitting: Internal Medicine

## 2016-08-09 DIAGNOSIS — Z1231 Encounter for screening mammogram for malignant neoplasm of breast: Secondary | ICD-10-CM

## 2016-08-11 DIAGNOSIS — L570 Actinic keratosis: Secondary | ICD-10-CM | POA: Diagnosis not present

## 2016-08-11 DIAGNOSIS — L821 Other seborrheic keratosis: Secondary | ICD-10-CM | POA: Diagnosis not present

## 2016-08-11 DIAGNOSIS — D1801 Hemangioma of skin and subcutaneous tissue: Secondary | ICD-10-CM | POA: Diagnosis not present

## 2016-08-11 DIAGNOSIS — Z85828 Personal history of other malignant neoplasm of skin: Secondary | ICD-10-CM | POA: Diagnosis not present

## 2016-08-12 DIAGNOSIS — H2513 Age-related nuclear cataract, bilateral: Secondary | ICD-10-CM | POA: Diagnosis not present

## 2016-08-12 DIAGNOSIS — H35412 Lattice degeneration of retina, left eye: Secondary | ICD-10-CM | POA: Diagnosis not present

## 2016-08-12 DIAGNOSIS — H43813 Vitreous degeneration, bilateral: Secondary | ICD-10-CM | POA: Diagnosis not present

## 2016-08-12 DIAGNOSIS — H33311 Horseshoe tear of retina without detachment, right eye: Secondary | ICD-10-CM | POA: Diagnosis not present

## 2016-09-11 DIAGNOSIS — Z23 Encounter for immunization: Secondary | ICD-10-CM | POA: Diagnosis not present

## 2016-09-13 ENCOUNTER — Ambulatory Visit: Payer: Medicare Other

## 2016-09-14 ENCOUNTER — Ambulatory Visit
Admission: RE | Admit: 2016-09-14 | Discharge: 2016-09-14 | Disposition: A | Discharge status: Home / Self Care | Payer: Medicare Other | Source: Ambulatory Visit | Attending: Internal Medicine | Admitting: Internal Medicine

## 2016-09-14 DIAGNOSIS — Z1231 Encounter for screening mammogram for malignant neoplasm of breast: Secondary | ICD-10-CM

## 2016-11-15 DIAGNOSIS — M859 Disorder of bone density and structure, unspecified: Secondary | ICD-10-CM | POA: Diagnosis not present

## 2016-11-15 DIAGNOSIS — Z79899 Other long term (current) drug therapy: Secondary | ICD-10-CM | POA: Diagnosis not present

## 2016-11-15 DIAGNOSIS — R7301 Impaired fasting glucose: Secondary | ICD-10-CM | POA: Diagnosis not present

## 2016-11-22 DIAGNOSIS — Z23 Encounter for immunization: Secondary | ICD-10-CM | POA: Diagnosis not present

## 2016-11-22 DIAGNOSIS — Z1389 Encounter for screening for other disorder: Secondary | ICD-10-CM | POA: Diagnosis not present

## 2016-11-22 DIAGNOSIS — M859 Disorder of bone density and structure, unspecified: Secondary | ICD-10-CM | POA: Diagnosis not present

## 2016-11-22 DIAGNOSIS — M25561 Pain in right knee: Secondary | ICD-10-CM | POA: Diagnosis not present

## 2016-11-22 DIAGNOSIS — C4491 Basal cell carcinoma of skin, unspecified: Secondary | ICD-10-CM | POA: Diagnosis not present

## 2016-11-22 DIAGNOSIS — H9193 Unspecified hearing loss, bilateral: Secondary | ICD-10-CM | POA: Diagnosis not present

## 2016-11-22 DIAGNOSIS — Z681 Body mass index (BMI) 19 or less, adult: Secondary | ICD-10-CM | POA: Diagnosis not present

## 2016-11-22 DIAGNOSIS — J302 Other seasonal allergic rhinitis: Secondary | ICD-10-CM | POA: Diagnosis not present

## 2016-11-22 DIAGNOSIS — R7301 Impaired fasting glucose: Secondary | ICD-10-CM | POA: Diagnosis not present

## 2016-11-22 DIAGNOSIS — R946 Abnormal results of thyroid function studies: Secondary | ICD-10-CM | POA: Diagnosis not present

## 2016-11-22 DIAGNOSIS — M25562 Pain in left knee: Secondary | ICD-10-CM | POA: Diagnosis not present

## 2016-11-22 DIAGNOSIS — Z0000 Encounter for general adult medical examination without abnormal findings: Secondary | ICD-10-CM | POA: Diagnosis not present

## 2016-11-30 IMAGING — MG 2D DIGITAL SCREENING BILATERAL MAMMOGRAM WITH CAD AND ADJUNCT TO
9 of 13 series · 9 of 29 positions shown · non-contrast
Comparison: Previous exam(s).

CLINICAL DATA: Screening.

EXAM:
2D DIGITAL SCREENING BILATERAL MAMMOGRAM WITH CAD AND ADJUNCT TOMO

[L MLO (1 of 2)]
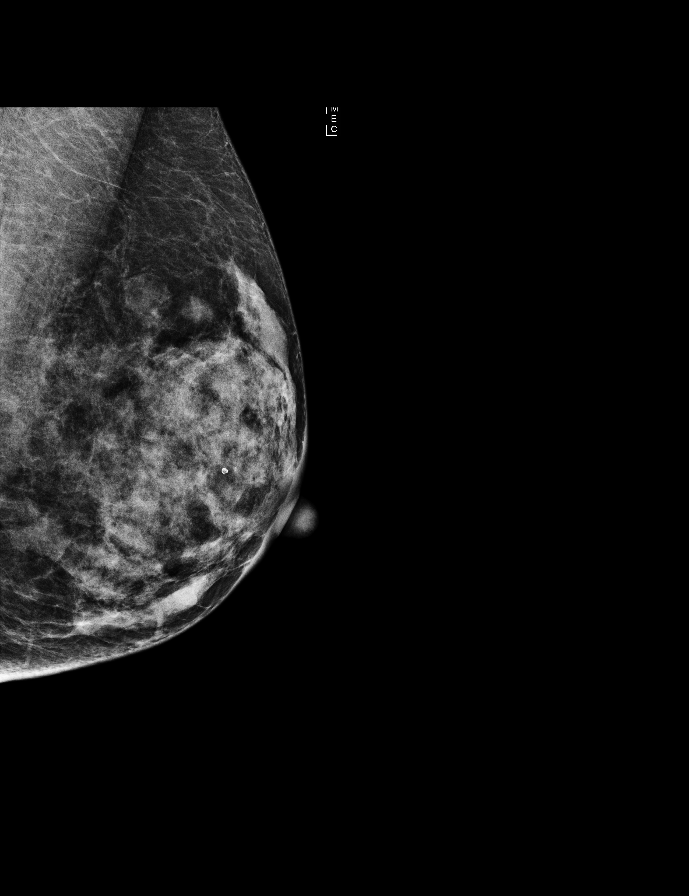

[L MLO (2 of 2)]
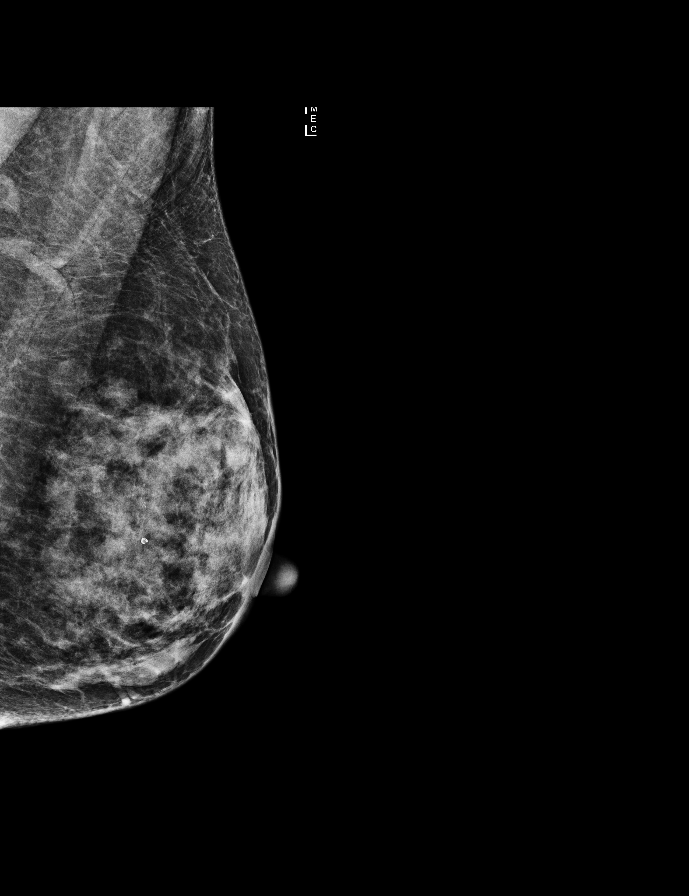

[R CC synth-2D]
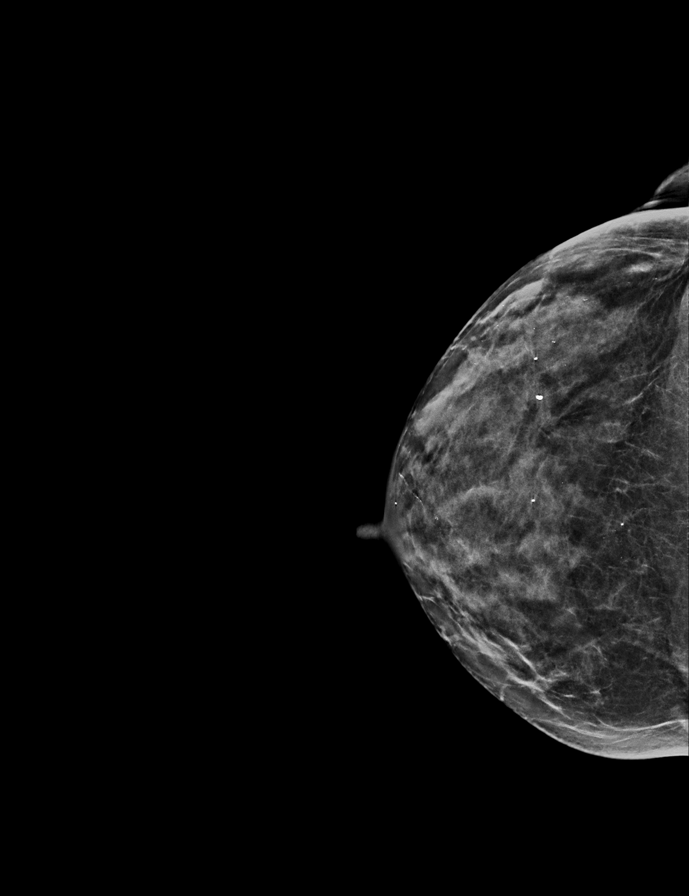

[L CC]
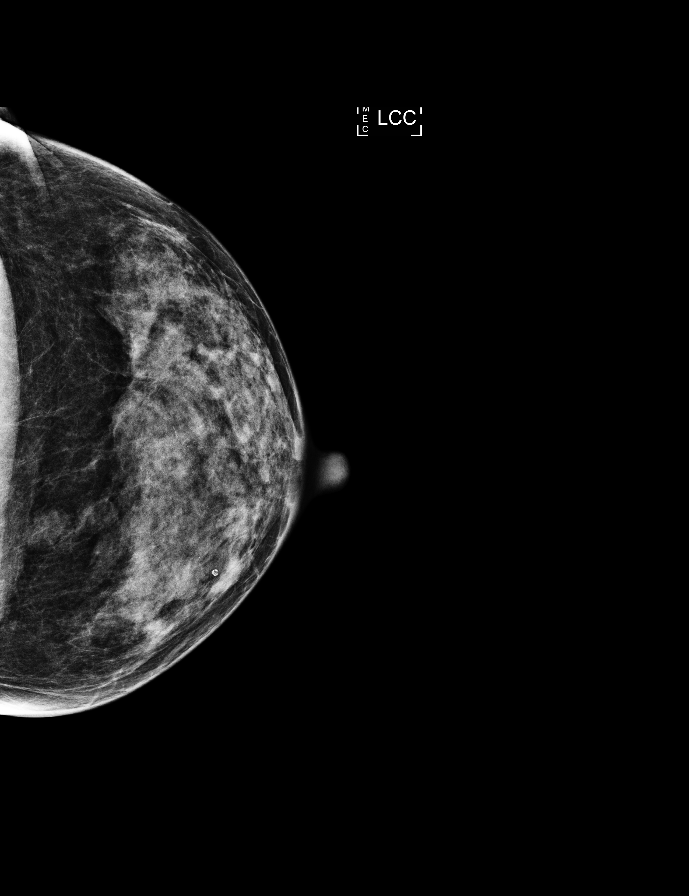

[L MLO synth-2D]
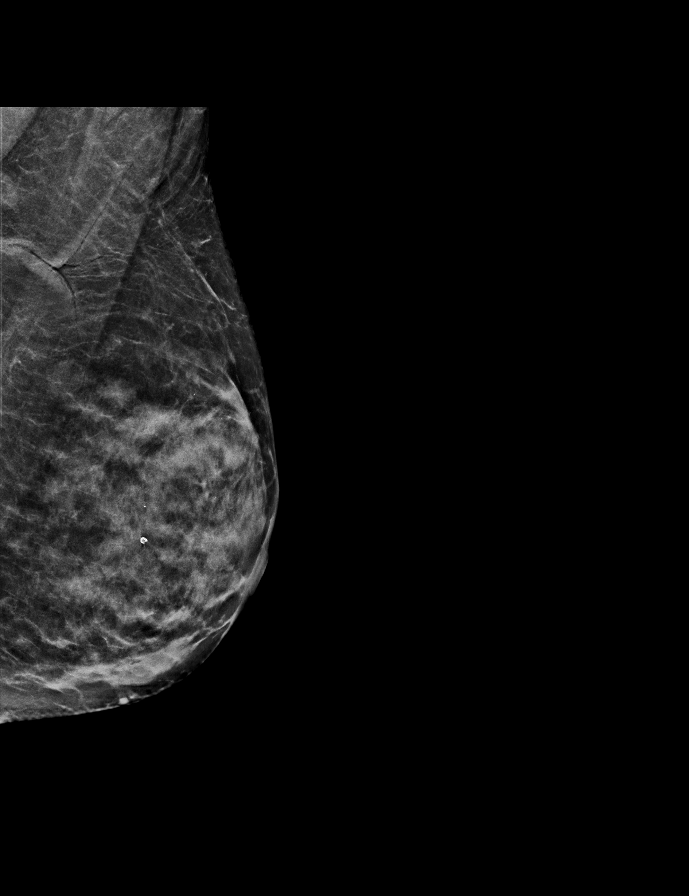

[R MLO]
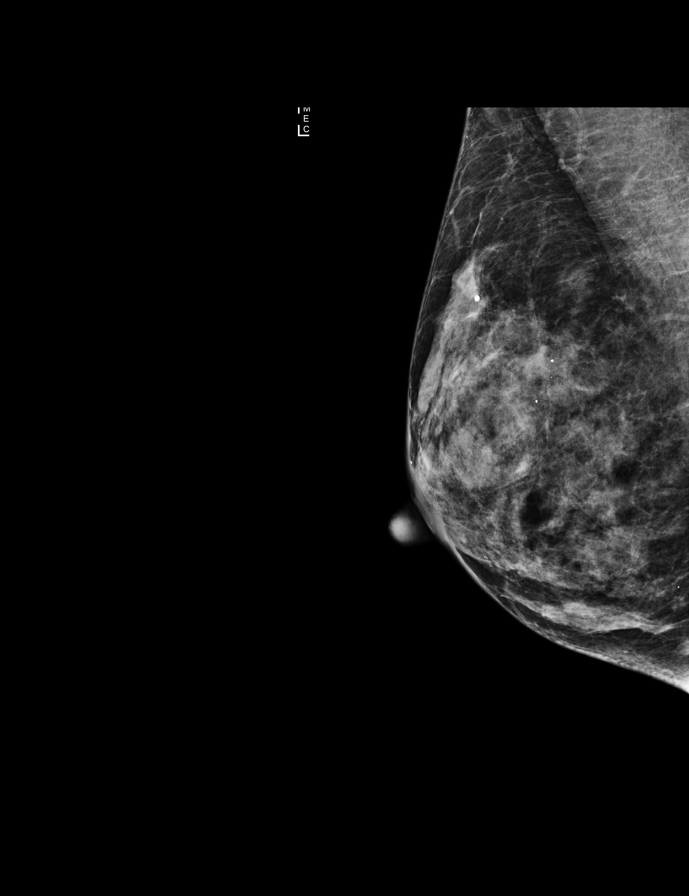

[L CC synth-2D]
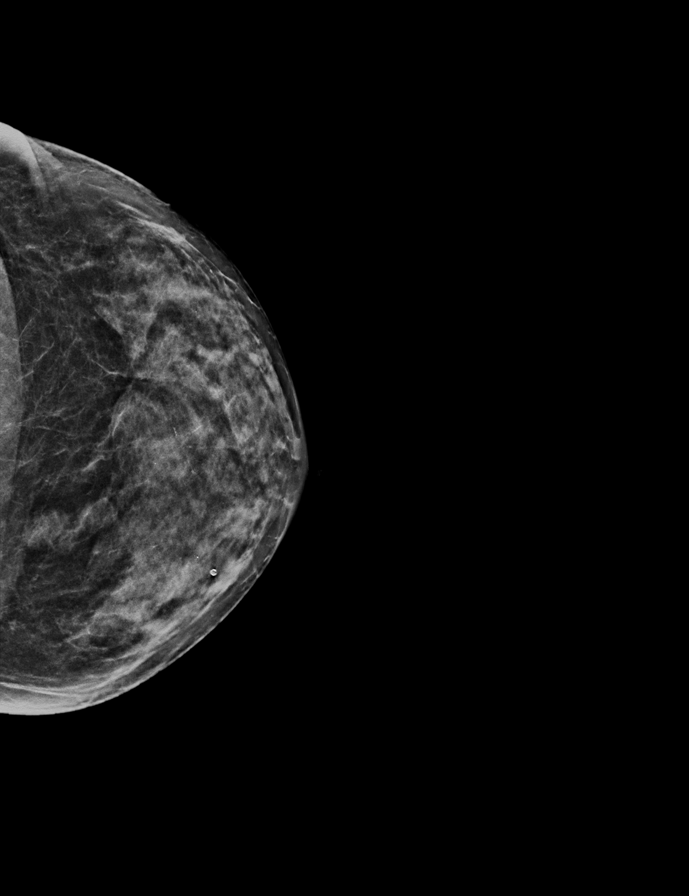

[R CC]
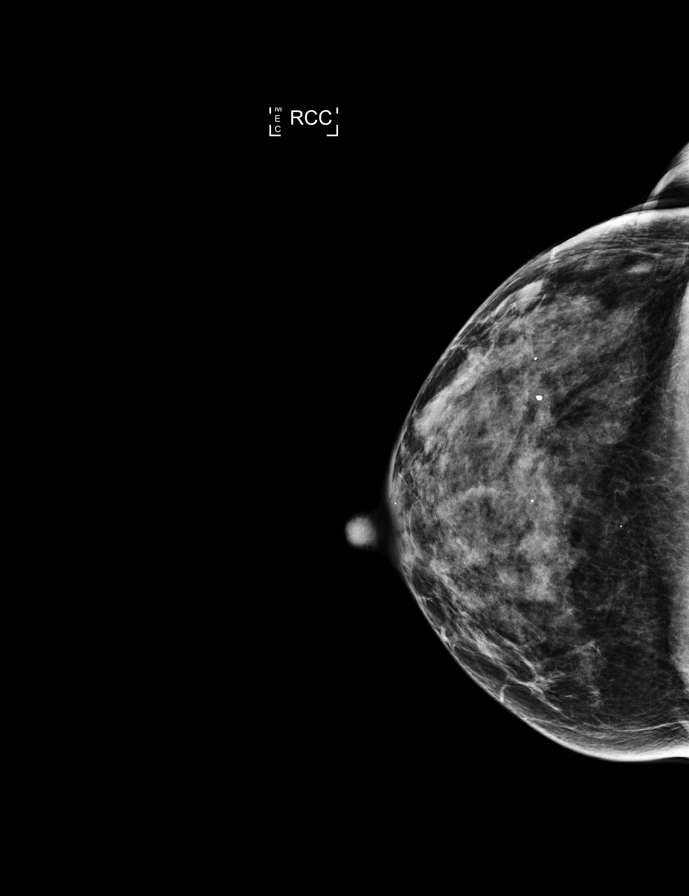

[R MLO synth-2D]
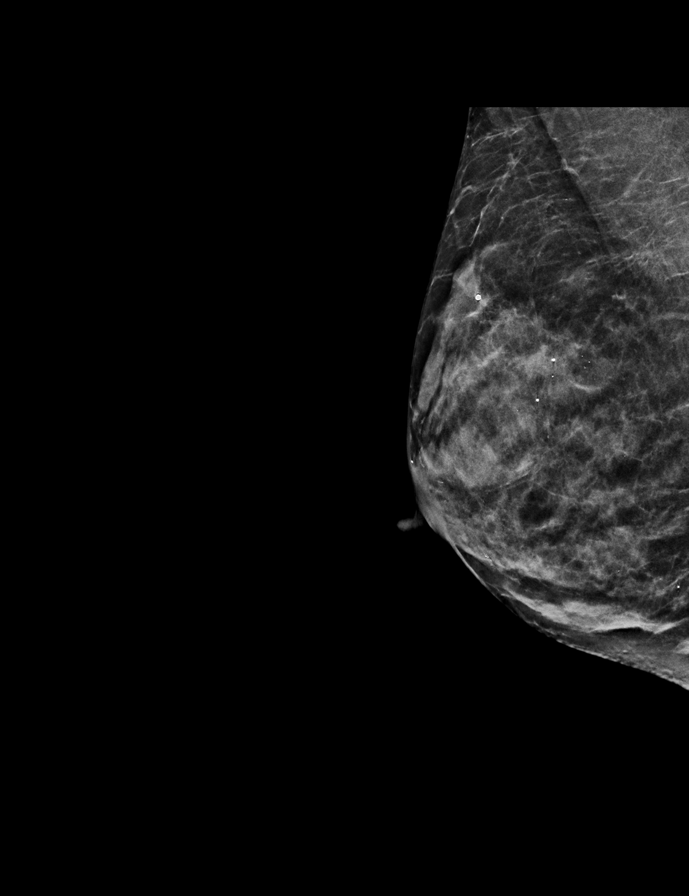

[9 of 29 positions shown; findings below may reference images not displayed]

ACR Breast Density Category c: The breast tissue is heterogeneously
dense, which may obscure small masses.
FINDINGS: There are no findings suspicious for malignancy. Images were
processed with CAD.
IMPRESSION: No mammographic evidence of malignancy. A result letter of this
screening mammogram will be mailed directly to the patient.

RECOMMENDATION:
Screening mammogram in one year. (Code:TN-[id number])

BI-RADS CATEGORY  1: Negative.

## 2016-12-23 ENCOUNTER — Encounter: Payer: Medicare Other | Admitting: Gynecology

## 2016-12-24 ENCOUNTER — Encounter: Payer: Medicare Other | Admitting: Gynecology

## 2016-12-24 ENCOUNTER — Encounter: Payer: Self-pay | Admitting: Gynecology

## 2016-12-24 ENCOUNTER — Ambulatory Visit (INDEPENDENT_AMBULATORY_CARE_PROVIDER_SITE_OTHER): Payer: Medicare Other | Admitting: Gynecology

## 2016-12-24 VITALS — BP 118/79 | Ht 64.0 in | Wt 106.0 lb

## 2016-12-24 DIAGNOSIS — N952 Postmenopausal atrophic vaginitis: Secondary | ICD-10-CM

## 2016-12-24 DIAGNOSIS — Z01411 Encounter for gynecological examination (general) (routine) with abnormal findings: Secondary | ICD-10-CM | POA: Diagnosis not present

## 2016-12-24 DIAGNOSIS — M8580 Other specified disorders of bone density and structure, unspecified site: Secondary | ICD-10-CM

## 2016-12-24 DIAGNOSIS — L900 Lichen sclerosus et atrophicus: Secondary | ICD-10-CM

## 2016-12-24 MED ORDER — CLOBETASOL PROPIONATE 0.05 % EX CREA: 1.0000 "application " | TOPICAL_CREAM | Freq: Two times a day (BID) | CUTANEOUS | 2 refills | Status: DC

## 2016-12-24 NOTE — Addendum Note (Signed)
Addended by: Burnett Kanaris on: 12/24/2016 02:34 PM   Modules accepted: Orders

## 2016-12-24 NOTE — Progress Notes (Signed)
Madison [name] 1946/06/23 [id number]   History:    71 y.o.  for annual gyn exam with no complaints today. She was seen last year for the first time in 4 years.Patient has been on Evista 60 mg daily that her previous gynecologist her on for bone protection and with history of low bone mass (osteopenia) as well as for breast cancer protection since her mother had breast cancer. She reports no like cramps or any side effects. Her PCP has been Dr. Elta Guadeloupe Pirrini who is been doing her blood work and has been doing her bone density studies as well. Patient stated she had a colonoscopy in 2015 but does have a history of benign colon polyps and is on a 5 year recall. She is no longer using Vagifem for vaginal atrophy.She is having no vaginal bleeding. She is having no pelvic pain. She is not having dysuria, urgency, frequency or incontinence of urination. Patient with no past history of any abnormal Pap smears in the past  Past medical history,surgical history, family history and social history were all reviewed and documented in the EPIC chart.  Gynecologic History No LMP recorded. Patient is postmenopausal. Contraception: post menopausal status Last Pap: 2011. Results were: normal Last mammogram: 2017. Results were: Normal but dense had three-dimensional mammogram  Obstetric History OB History  Gravida Para Term Preterm AB Living  2 2 2     2   SAB TAB Ectopic Multiple Live Births               # Outcome Date GA Lbr Len/2nd Weight Sex Delivery Anes PTL Lv  2 Term           1 Term                ROS: A ROS was performed and pertinent positives and negatives are included in the history.  GENERAL: No fevers or chills. HEENT: No change in vision, no earache, sore throat or sinus congestion. NECK: No pain or stiffness. CARDIOVASCULAR: No chest pain or pressure. No palpitations. PULMONARY: No shortness of breath, cough or wheeze. GASTROINTESTINAL: No abdominal pain, nausea, vomiting or diarrhea, melena  or bright red blood per rectum. GENITOURINARY: No urinary frequency, urgency, hesitancy or dysuria. MUSCULOSKELETAL: No joint or muscle pain, no back pain, no recent trauma. DERMATOLOGIC: No rash, no itching, no lesions. ENDOCRINE: No polyuria, polydipsia, no heat or cold intolerance. No recent change in weight. HEMATOLOGICAL: No anemia or easy bruising or bleeding. NEUROLOGIC: No headache, seizures, numbness, tingling or weakness. PSYCHIATRIC: No depression, no loss of interest in normal activity or change in sleep pattern.     Exam: chaperone present  BP 118/79   Ht 5\' 4"  (1.626 m)   Wt 106 lb (48.1 kg)   BMI 18.19 kg/m   Body mass index is 18.19 kg/m.  General appearance : Well developed well nourished female. No acute distress HEENT: Eyes: no retinal hemorrhage or exudates,  Neck supple, trachea midline, no carotid bruits, no thyroidmegaly Lungs: Clear to auscultation, no rhonchi or wheezes, or rib retractions  Heart: Regular rate and rhythm, no murmurs or gallops Breast:Examined in sitting and supine position were symmetrical in appearance, no palpable masses or tenderness,  no skin retraction, no nipple inversion, no nipple discharge, no skin discoloration, no axillary or supraclavicular lymphadenopathy Abdomen: no palpable masses or tenderness, no rebound or guarding Extremities: no edema or skin discoloration or tenderness  Pelvic: Fourchette area suspicious for lichen sclerosis  Bartholin, Urethra, Skene Glands:  Within normal limits             Vagina: No gross lesions or discharge, atrophic changes  Cervix: No gross lesions or discharge  Uterus  axial, normal size, shape and consistency, non-tender and mobile  Adnexa  Without masses or tenderness  Anus and perineum  normal   Rectovaginal  normal sphincter tone without palpated masses or tenderness             Hemoccult PCP provides     Assessment/Plan:  71 y.o. female for annual exam with incidental finding of what  appears to be lichen sclerosus. She is going to be treated with clobetasol 0.05% cream to apply daily at bedtime for one week then twice week for 3 months then return to the office for follow-up evaluation. If this area is not cleared up I explained to her that she will need a biopsy. Her PCP has informed her that her bone density study indicated she has osteopenia but stable she give her next bone density study in 2019. We discussed importance of calcium vitamin D and weightbearing exercises for osteoporosis prevention. Patient will longer any Pap smear. Her blood work and vaccines of been done by her PCP.  An additional 15 minutes was spent evaluating her external genitalia with magnifying lens and discussion of lichen sclerosus and vulvar dystrophy and possible biopsy.   Terrance Mass MD, 2:24 PM 12/24/2016

## 2016-12-24 NOTE — Patient Instructions (Signed)
Lichen Sclerosus Introduction Lichen sclerosus is a skin problem. It can happen on any part of the body, but it commonly involves the anal or genital areas. It can cause itching and discomfort in these areas. Treatment can help to control symptoms. When the genital area is affected, getting treatment is important because the condition can cause scarring that Cdebaca lead to other problems. What are the causes? The cause of this condition is not known. It could be the result of an overactive immune system or a lack of certain hormones. Lichen sclerosus is not an infection or a fungus. It is not passed from one person to another (not contagious). What increases the risk? This condition is more likely to develop in women, usually after menopause. What are the signs or symptoms? Symptoms of this condition include:  Thin, wrinkled, white areas on the skin.  Thickened white areas on the skin.  Red and swollen patches (lesions) on the skin.  Tears or cracks in the skin.  Bruising.  Blood blisters.  Severe itching. You Equihua also have pain, itching, or burning with urination. Constipation is also common in people with lichen sclerosus. How is this diagnosed? This condition Messner be diagnosed with a physical exam. In some cases, a tissue sample (biopsy sample) Mcwright be removed to be looked at under a microscope. How is this treated? This condition is usually treated with medicated creams or ointments (topical steroids) that are applied over the affected areas. Follow these instructions at home:  Take over-the-counter and prescription medicines only as told by your health care provider.  Use creams or ointments as told by your health care provider.  Do not scratch the affected areas of skin.  Women should keep the vaginal area as clean and dry as possible.  Keep all follow-up visits as told by your health care provider. This is important. Contact a health care provider if:  You have increasing  redness, swelling, or pain in the affected area.  You have fluid, blood, or pus coming from the affected area.  You have new lesions on your skin.  You have a fever.  You have pain during sex. This information is not intended to replace advice given to you by your health care provider. Make sure you discuss any questions you have with your health care provider. Document Released: 04/21/2011 Document Revised: 05/06/2016 Document Reviewed: 02/24/2015  2017 Elsevier

## 2016-12-27 ENCOUNTER — Telehealth: Payer: Self-pay

## 2016-12-27 NOTE — Telephone Encounter (Signed)
Patient informed. 

## 2016-12-27 NOTE — Telephone Encounter (Signed)
Patient wants to buy an over-the-counter cortisone cream she can apply it every day before bedtime for one week then twice a week and then follow up with me is instructed

## 2016-12-27 NOTE — Telephone Encounter (Signed)
Note from Pharmacy received in regards to Clobetasol Cream.  "Patient requests new Rx. Clobetasol is $131.22 on patient's insurance. Is there an alternative?"

## 2017-03-16 DIAGNOSIS — R946 Abnormal results of thyroid function studies: Secondary | ICD-10-CM | POA: Diagnosis not present

## 2017-03-23 ENCOUNTER — Ambulatory Visit (INDEPENDENT_AMBULATORY_CARE_PROVIDER_SITE_OTHER): Payer: Medicare Other | Admitting: Gynecology

## 2017-03-23 ENCOUNTER — Encounter: Payer: Self-pay | Admitting: Gynecology

## 2017-03-23 VITALS — Ht 64.0 in | Wt 110.4 lb

## 2017-03-23 DIAGNOSIS — N889 Noninflammatory disorder of cervix uteri, unspecified: Secondary | ICD-10-CM | POA: Diagnosis not present

## 2017-03-23 DIAGNOSIS — N9089 Other specified noninflammatory disorders of vulva and perineum: Secondary | ICD-10-CM

## 2017-03-23 DIAGNOSIS — N904 Leukoplakia of vulva: Secondary | ICD-10-CM | POA: Diagnosis not present

## 2017-03-23 DIAGNOSIS — D269 Other benign neoplasm of uterus, unspecified: Secondary | ICD-10-CM | POA: Diagnosis not present

## 2017-03-23 NOTE — Addendum Note (Signed)
Addended by: Dorothyann Gibbs on: 03/23/2017 03:26 PM   Modules accepted: Orders

## 2017-03-23 NOTE — Patient Instructions (Signed)
Colposcopy, Care After This sheet gives you information about how to care for yourself after your procedure. Your health care provider Weissberg also give you more specific instructions. If you have problems or questions, contact your health care provider. What can I expect after the procedure? If you had a colposcopy without a biopsy, you can expect to feel fine right away, but you Papadakis have some spotting for a few days. You can go back to your normal activities. If you had a colposcopy with a biopsy, it is common to have:  Soreness and pain. This Urbanski last for a few days.  Light-headedness.  Mild vaginal bleeding or dark-colored, grainy discharge. This Mellone last for a few days. The discharge Burkemper be due to a solution that was used during the procedure. You Jayne need to wear a sanitary pad during this time.  Spotting for at least 48 hours after the procedure. Follow these instructions at home:  Take over-the-counter and prescription medicines only as told by your health care provider. Talk with your health care provider about what type of over-the-counter pain medicine and prescription medicine you can start taking again. It is especially important to talk with your health care provider if you take blood-thinning medicine.  Do not drive or use heavy machinery while taking prescription pain medicine.  For at least 3 days after your procedure, or as long as told by your health care provider, avoid:  Douching.  Using tampons.  Having sexual intercourse.  Continue to use birth control (contraception).  Limit your physical activity for the first day after the procedure as told by your health care provider. Ask your health care provider what activities are safe for you.  It is up to you to get the results of your procedure. Ask your health care provider, or the department performing the procedure, when your results will be ready.  Keep all follow-up visits as told by your health care provider. This  is important. Contact a health care provider if:  You develop a skin rash. Get help right away if:  You are bleeding heavily from your vagina or you are passing blood clots. This includes using more than one sanitary pad per hour for 2 hours in a row.  You have a fever or chills.  You have pelvic pain.  You have abnormal, yellow-colored, or bad-smelling vaginal discharge. This could be a sign of infection.  You have severe pain or cramps in your lower abdomen that do not get better with medicine.  You feel light-headed or dizzy, or you faint. Summary  If you had a colposcopy without a biopsy, you can expect to feel fine right away, but you File have some spotting for a few days. You can go back to your normal activities.  If you had a colposcopy with a biopsy, you Dimaggio notice mild pain and spotting for 48 hours after the procedure.  Avoid douching, using tampons, and having sexual intercourse for 3 days after the procedure or as long as told by your health care provider.  Contact your health care provider if you have bleeding, severe pain, or signs of infection. This information is not intended to replace advice given to you by your health care provider. Make sure you discuss any questions you have with your health care provider. Document Released: 09/19/2013 Document Revised: 07/16/2016 Document Reviewed: 07/16/2016 Elsevier Interactive Patient Education  2017 Elsevier Inc.  

## 2017-03-23 NOTE — Progress Notes (Signed)
   Patient is a 71 year old who was seen in the office in January 12 for her annual exam was asymptomatic no longer using vaginal estrogen see previous note for detail. Incidental finding at time of her exam demonstrated leukoplakic flat area near the fourchette the working diagnosis was possible lichen sclerosus and she had been prescribed clobetasol 0.05 to apply twice a day daily for one week and then to apply twice a week for 3 months and to follow-up and if still present we will going to biopsy to confirm the diagnosis. At first she felt that it was too expensive so she tried over-the-counter steroid cream for short time and for the past 2 months has been using the clobetasol. She is here for follow-up. Patient with no past history of any abnormal Pap smears.  Patient underwent a detail colposcopic evaluation external genitalia perineum and perirectal region. Once again leukoplakic area near the fourchette was noted speculum was introduced into the vagina systematic inspection only demonstrated vaginal atrophy but the ectocervix at demonstrated what appeared to be a flat endocervical polyp versus glandular tissue. We obtained a Pap smear first and then a biopsy from this area between the 6 and 7:00 position and used silver nitrate for hemostasis. Attention was then placed near the fourchette with a 5 leukoplakic area was noted there was cleansed with Betadine solution 1% lidocaine was infiltrated to the base for total 3 cc and a key punch biopsy was obtained from the area and all specimen was submitted for histological evaluation. Silver nitrate and Monsel solution was used for hemostasis and near the fourchette. The following it was traced the 2 areas noted:  Physical Exam  Genitourinary:     Assessment/plan: #1 local area of fourchette highly suspicious for lichen sclerosis nevertheless a biopsy was obtained. Patient will wait 10 days before beginning the clobetasol cream again will notify with the  results of biopsy when becomes available. She will then return in 3 months for follow-up evaluation. She'll apply the clobetasol cream twice a week. #10 flat erythematous area at the 60 7:00 position of the ectocervix was biopsied after Pap smear was obtained results pending at time of this dictation

## 2017-03-26 LAB — PAP, TP IMAGING W/ HPV RNA, RFLX HPV TYPE 16,18/45: HPV mRNA, High Risk: NOT DETECTED

## 2017-04-27 ENCOUNTER — Encounter: Payer: Self-pay | Admitting: Gynecology

## 2017-07-13 ENCOUNTER — Encounter: Payer: Self-pay | Admitting: Gynecology

## 2017-07-13 ENCOUNTER — Ambulatory Visit (INDEPENDENT_AMBULATORY_CARE_PROVIDER_SITE_OTHER): Payer: Medicare Other | Admitting: Gynecology

## 2017-07-13 VITALS — BP 98/68 | Wt 104.6 lb

## 2017-07-13 DIAGNOSIS — L900 Lichen sclerosus et atrophicus: Secondary | ICD-10-CM | POA: Diagnosis not present

## 2017-07-13 NOTE — Patient Instructions (Signed)
Colposcopy, Care After This sheet gives you information about how to care for yourself after your procedure. Your health care provider Lenig also give you more specific instructions. If you have problems or questions, contact your health care provider. What can I expect after the procedure? If you had a colposcopy without a biopsy, you can expect to feel fine right away, but you Kirshner have some spotting for a few days. You can go back to your normal activities. If you had a colposcopy with a biopsy, it is common to have:  Soreness and pain. This Laine last for a few days.  Light-headedness.  Mild vaginal bleeding or dark-colored, grainy discharge. This Francom last for a few days. The discharge Bumbaugh be due to a solution that was used during the procedure. You Gasparini need to wear a sanitary pad during this time.  Spotting for at least 48 hours after the procedure.  Follow these instructions at home:  Take over-the-counter and prescription medicines only as told by your health care provider. Talk with your health care provider about what type of over-the-counter pain medicine and prescription medicine you can start taking again. It is especially important to talk with your health care provider if you take blood-thinning medicine.  Do not drive or use heavy machinery while taking prescription pain medicine.  For at least 3 days after your procedure, or as long as told by your health care provider, avoid: ? Douching. ? Using tampons. ? Having sexual intercourse.  Continue to use birth control (contraception).  Limit your physical activity for the first day after the procedure as told by your health care provider. Ask your health care provider what activities are safe for you.  It is up to you to get the results of your procedure. Ask your health care provider, or the department performing the procedure, when your results will be ready.  Keep all follow-up visits as told by your health care provider.  This is important. Contact a health care provider if:  You develop a skin rash. Get help right away if:  You are bleeding heavily from your vagina or you are passing blood clots. This includes using more than one sanitary pad per hour for 2 hours in a row.  You have a fever or chills.  You have pelvic pain.  You have abnormal, yellow-colored, or bad-smelling vaginal discharge. This could be a sign of infection.  You have severe pain or cramps in your lower abdomen that do not get better with medicine.  You feel light-headed or dizzy, or you faint. Summary  If you had a colposcopy without a biopsy, you can expect to feel fine right away, but you Claxton have some spotting for a few days. You can go back to your normal activities.  If you had a colposcopy with a biopsy, you Wycoff notice mild pain and spotting for 48 hours after the procedure.  Avoid douching, using tampons, and having sexual intercourse for 3 days after the procedure or as long as told by your health care provider.  Contact your health care provider if you have bleeding, severe pain, or signs of infection. This information is not intended to replace advice given to you by your health care provider. Make sure you discuss any questions you have with your health care provider. Document Released: 09/19/2013 Document Revised: 07/16/2016 Document Reviewed: 07/16/2016 Elsevier Interactive Patient Education  2018 Elsevier Inc.  

## 2017-07-13 NOTE — Progress Notes (Signed)
   Patient is a 71 year old who presented to the office today for colposcopic evaluation follow-up on a vulvar lesion in the fourchette which was found incidentally was biopsied in the past and it was lichen sclerosus and she was started on clobetasol 0.05% which she is now using twice a week. See previous note for detail.  Patient underwent a detail colposcopic evaluation external genitalia after applying acetic acid the area fourchette leukoplakic area with no longer present and the rest of her external genitalia only atrophic changes but no lesions seen. Perineum and perirectal region were normal. A speculum was introduced into the vagina atrophic changes were noted cervix and fornix no lesions seen.  Normal Pap smear April 2018. Benign squamous columnar epithelium on colposcopic directed biopsy done in April 2018 and normal Pap smear Biopsy from the fourchette April 1497 confirm lichen sclerosus no malignancy seen  Assessment/plan: 71 year old with history of lichen sclerosus of fourchette responded to clobetasol 0.05% which she's been applied twice a week. She'll continue for the rest of the month of August and then once a week for the month of September then discontinue. He will be rechecked which she returns in January for her annual exam.

## 2017-08-08 ENCOUNTER — Other Ambulatory Visit: Payer: Self-pay | Admitting: Internal Medicine

## 2017-08-08 DIAGNOSIS — Z1231 Encounter for screening mammogram for malignant neoplasm of breast: Secondary | ICD-10-CM

## 2017-08-22 DIAGNOSIS — D485 Neoplasm of uncertain behavior of skin: Secondary | ICD-10-CM | POA: Diagnosis not present

## 2017-08-22 DIAGNOSIS — Z85828 Personal history of other malignant neoplasm of skin: Secondary | ICD-10-CM | POA: Diagnosis not present

## 2017-08-22 DIAGNOSIS — L570 Actinic keratosis: Secondary | ICD-10-CM | POA: Diagnosis not present

## 2017-08-22 DIAGNOSIS — L820 Inflamed seborrheic keratosis: Secondary | ICD-10-CM | POA: Diagnosis not present

## 2017-08-22 DIAGNOSIS — C44612 Basal cell carcinoma of skin of right upper limb, including shoulder: Secondary | ICD-10-CM | POA: Diagnosis not present

## 2017-08-22 DIAGNOSIS — L821 Other seborrheic keratosis: Secondary | ICD-10-CM | POA: Diagnosis not present

## 2017-08-27 DIAGNOSIS — Z23 Encounter for immunization: Secondary | ICD-10-CM | POA: Diagnosis not present

## 2017-09-01 DIAGNOSIS — H35412 Lattice degeneration of retina, left eye: Secondary | ICD-10-CM | POA: Diagnosis not present

## 2017-09-01 DIAGNOSIS — H33311 Horseshoe tear of retina without detachment, right eye: Secondary | ICD-10-CM | POA: Diagnosis not present

## 2017-09-01 DIAGNOSIS — H2513 Age-related nuclear cataract, bilateral: Secondary | ICD-10-CM | POA: Diagnosis not present

## 2017-09-01 DIAGNOSIS — H43813 Vitreous degeneration, bilateral: Secondary | ICD-10-CM | POA: Diagnosis not present

## 2017-09-21 ENCOUNTER — Ambulatory Visit
Admission: RE | Admit: 2017-09-21 | Discharge: 2017-09-21 | Disposition: A | Discharge status: Home / Self Care | Payer: Medicare Other | Source: Ambulatory Visit | Attending: Internal Medicine | Admitting: Internal Medicine

## 2017-09-21 DIAGNOSIS — Z1231 Encounter for screening mammogram for malignant neoplasm of breast: Secondary | ICD-10-CM

## 2017-09-28 DIAGNOSIS — Z85828 Personal history of other malignant neoplasm of skin: Secondary | ICD-10-CM | POA: Diagnosis not present

## 2017-09-28 DIAGNOSIS — I788 Other diseases of capillaries: Secondary | ICD-10-CM | POA: Diagnosis not present

## 2017-09-28 DIAGNOSIS — L821 Other seborrheic keratosis: Secondary | ICD-10-CM | POA: Diagnosis not present

## 2017-09-28 DIAGNOSIS — L718 Other rosacea: Secondary | ICD-10-CM | POA: Diagnosis not present

## 2017-09-28 DIAGNOSIS — D1801 Hemangioma of skin and subcutaneous tissue: Secondary | ICD-10-CM | POA: Diagnosis not present

## 2017-11-23 DIAGNOSIS — R82998 Other abnormal findings in urine: Secondary | ICD-10-CM | POA: Diagnosis not present

## 2017-11-23 DIAGNOSIS — R946 Abnormal results of thyroid function studies: Secondary | ICD-10-CM | POA: Diagnosis not present

## 2017-11-23 DIAGNOSIS — R7301 Impaired fasting glucose: Secondary | ICD-10-CM | POA: Diagnosis not present

## 2017-11-23 DIAGNOSIS — M859 Disorder of bone density and structure, unspecified: Secondary | ICD-10-CM | POA: Diagnosis not present

## 2017-11-23 DIAGNOSIS — E7849 Other hyperlipidemia: Secondary | ICD-10-CM | POA: Diagnosis not present

## 2017-11-24 DIAGNOSIS — C4491 Basal cell carcinoma of skin, unspecified: Secondary | ICD-10-CM | POA: Diagnosis not present

## 2017-11-24 DIAGNOSIS — Z1389 Encounter for screening for other disorder: Secondary | ICD-10-CM | POA: Diagnosis not present

## 2017-11-24 DIAGNOSIS — Z681 Body mass index (BMI) 19 or less, adult: Secondary | ICD-10-CM | POA: Diagnosis not present

## 2017-11-24 DIAGNOSIS — N3090 Cystitis, unspecified without hematuria: Secondary | ICD-10-CM | POA: Diagnosis not present

## 2017-11-24 DIAGNOSIS — R0781 Pleurodynia: Secondary | ICD-10-CM | POA: Diagnosis not present

## 2017-11-24 DIAGNOSIS — M25562 Pain in left knee: Secondary | ICD-10-CM | POA: Diagnosis not present

## 2017-11-24 DIAGNOSIS — Z9289 Personal history of other medical treatment: Secondary | ICD-10-CM | POA: Diagnosis not present

## 2017-11-24 DIAGNOSIS — Z0000 Encounter for general adult medical examination without abnormal findings: Secondary | ICD-10-CM | POA: Diagnosis not present

## 2017-11-24 DIAGNOSIS — R7301 Impaired fasting glucose: Secondary | ICD-10-CM | POA: Diagnosis not present

## 2017-11-24 DIAGNOSIS — M25561 Pain in right knee: Secondary | ICD-10-CM | POA: Diagnosis not present

## 2017-11-24 DIAGNOSIS — H9193 Unspecified hearing loss, bilateral: Secondary | ICD-10-CM | POA: Diagnosis not present

## 2017-11-24 DIAGNOSIS — J302 Other seasonal allergic rhinitis: Secondary | ICD-10-CM | POA: Diagnosis not present

## 2017-12-28 ENCOUNTER — Encounter: Payer: Medicare Other | Admitting: Obstetrics & Gynecology

## 2018-08-21 ENCOUNTER — Other Ambulatory Visit: Payer: Self-pay | Admitting: Internal Medicine

## 2018-08-21 DIAGNOSIS — Z1231 Encounter for screening mammogram for malignant neoplasm of breast: Secondary | ICD-10-CM

## 2018-09-04 ENCOUNTER — Encounter: Payer: Self-pay | Admitting: Obstetrics & Gynecology

## 2018-09-04 ENCOUNTER — Ambulatory Visit (INDEPENDENT_AMBULATORY_CARE_PROVIDER_SITE_OTHER): Payer: Medicare Other | Admitting: Obstetrics & Gynecology

## 2018-09-04 VITALS — BP 106/70 | Ht 64.25 in | Wt 106.0 lb

## 2018-09-04 DIAGNOSIS — Z780 Asymptomatic menopausal state: Secondary | ICD-10-CM | POA: Diagnosis not present

## 2018-09-04 DIAGNOSIS — M8589 Other specified disorders of bone density and structure, multiple sites: Secondary | ICD-10-CM

## 2018-09-04 DIAGNOSIS — Z01419 Encounter for gynecological examination (general) (routine) without abnormal findings: Secondary | ICD-10-CM

## 2018-09-04 DIAGNOSIS — N904 Leukoplakia of vulva: Secondary | ICD-10-CM

## 2018-09-04 NOTE — Patient Instructions (Signed)
1. Well female exam with routine gynecological exam Normal gynecologic exam and menopause except for mild Lichen Sclerosus of the vulva.  Pap test negative in April 2018.  Breast exam normal.  Will schedule screening mammogram in October 2019.  Health labs with Dr. Joylene Draft.  Will schedule colonoscopy in 2020.  2. Post-menopause Well on no hormone replacement therapy.  No postmenopausal bleeding.  3. Osteopenia of multiple sites Vitamin D supplements, calcium intake of 1.5 g/day and regular weightbearing physical activity to continue.  Will follow up with a bone density with Dr. Joylene Draft.  4. Lichen sclerosus et atrophicus of the vulva Mild lichen sclerosis of the posterior vulva.  Patient is asymptomatic and prefers no treatment at this time.  Glenyce, it was a pleasure meeting you today!

## 2018-09-04 NOTE — Progress Notes (Signed)
Madison [name] 1946-02-16 [id number]   History:    72 y.o. G2P2L2 married  RP:  Established patient presenting for annual gyn exam   HPI: Menopause, well on no HRT.  H/O Lichen Sclerosus of the Vulva which responded to Clobetasol 0.05%, not using any more and asymptomatic.  Abstinent.  Urine and bowel movements normal.  Breasts normal with occasional tenderness at the left upper side as patient sleeps on her belly.  Body mass index 18.05.  Very active physically.  Health labs with Dr. Joylene Draft.  Colonoscopy in 2015.  Past medical history,surgical history, family history and social history were all reviewed and documented in the EPIC chart.  Gynecologic History No LMP recorded. Patient is postmenopausal. Contraception: post menopausal status Last Pap: 03/2017. Results were: Negative Last mammogram: 09/2017. Results were: Negative Bone Density: Osteopenia.  Followed by Dr Joylene Draft. Colonoscopy: 2015, 5 yr schedule  Obstetric History OB History  Gravida Para Term Preterm AB Living  2 2 2     2   SAB TAB Ectopic Multiple Live Births               # Outcome Date GA Lbr Len/2nd Weight Sex Delivery Anes PTL Lv  2 Term           1 Term              ROS: A ROS was performed and pertinent positives and negatives are included in the history.  GENERAL: No fevers or chills. HEENT: No change in vision, no earache, sore throat or sinus congestion. NECK: No pain or stiffness. CARDIOVASCULAR: No chest pain or pressure. No palpitations. PULMONARY: No shortness of breath, cough or wheeze. GASTROINTESTINAL: No abdominal pain, nausea, vomiting or diarrhea, melena or bright red blood per rectum. GENITOURINARY: No urinary frequency, urgency, hesitancy or dysuria. MUSCULOSKELETAL: No joint or muscle pain, no back pain, no recent trauma. DERMATOLOGIC: No rash, no itching, no lesions. ENDOCRINE: No polyuria, polydipsia, no heat or cold intolerance. No recent change in weight. HEMATOLOGICAL: No anemia or easy  bruising or bleeding. NEUROLOGIC: No headache, seizures, numbness, tingling or weakness. PSYCHIATRIC: No depression, no loss of interest in normal activity or change in sleep pattern.     Exam:   BP 106/70   Ht 5' 4.25" (1.632 m)   Wt 106 lb (48.1 kg)   BMI 18.05 kg/m   Body mass index is 18.05 kg/m.  General appearance : Well developed well nourished female. No acute distress HEENT: Eyes: no retinal hemorrhage or exudates,  Neck supple, trachea midline, no carotid bruits, no thyroidmegaly Lungs: Clear to auscultation, no rhonchi or wheezes, or rib retractions  Heart: Regular rate and rhythm, no murmurs or gallops Breast:Examined in sitting and supine position were symmetrical in appearance, no palpable masses or tenderness,  no skin retraction, no nipple inversion, no nipple discharge, no skin discoloration, no axillary or supraclavicular lymphadenopathy Abdomen: no palpable masses or tenderness, no rebound or guarding Extremities: no edema or skin discoloration or tenderness  Pelvic: Vulva: Normal except for mild atrophic changes at the posterior fourchette/perineum             Vagina: No gross lesions or discharge  Cervix: No gross lesions or discharge  Uterus  AV, normal size, shape and consistency, non-tender and mobile  Adnexa  Without masses or tenderness  Anus: Normal   Assessment/Plan:  72 y.o. female for annual exam   1. Well female exam with routine gynecological exam Normal gynecologic exam and menopause  except for mild Lichen Sclerosus of the vulva.  Pap test negative in April 2018.  Breast exam normal.  Will schedule screening mammogram in October 2019.  Health labs with Dr. Joylene Draft.  Will schedule colonoscopy in 2020.  2. Post-menopause Well on no hormone replacement therapy.  No postmenopausal bleeding.  3. Osteopenia of multiple sites Vitamin D supplements, calcium intake of 1.5 g/day and regular weightbearing physical activity to continue.  Will follow up with  a bone density with Dr. Joylene Draft.  4. Lichen sclerosus et atrophicus of the vulva Mild lichen sclerosis of the posterior vulva.  Patient is asymptomatic and prefers no treatment at this time.  Counseling on above issues and coordination of care more than 50% for 10 minutes.  Princess Bruins MD, 11:56 AM 09/04/2018

## 2018-09-09 DIAGNOSIS — Z23 Encounter for immunization: Secondary | ICD-10-CM | POA: Diagnosis not present

## 2018-09-21 DIAGNOSIS — Z8669 Personal history of other diseases of the nervous system and sense organs: Secondary | ICD-10-CM | POA: Diagnosis not present

## 2018-09-21 DIAGNOSIS — H2513 Age-related nuclear cataract, bilateral: Secondary | ICD-10-CM | POA: Diagnosis not present

## 2018-09-21 DIAGNOSIS — H43813 Vitreous degeneration, bilateral: Secondary | ICD-10-CM | POA: Diagnosis not present

## 2018-09-21 DIAGNOSIS — H35412 Lattice degeneration of retina, left eye: Secondary | ICD-10-CM | POA: Diagnosis not present

## 2018-09-25 ENCOUNTER — Ambulatory Visit
Admission: RE | Admit: 2018-09-25 | Discharge: 2018-09-25 | Disposition: A | Discharge status: Home / Self Care | Payer: Medicare Other | Source: Ambulatory Visit | Attending: Internal Medicine | Admitting: Internal Medicine

## 2018-09-25 DIAGNOSIS — Z1231 Encounter for screening mammogram for malignant neoplasm of breast: Secondary | ICD-10-CM | POA: Diagnosis not present

## 2019-01-08 DIAGNOSIS — M859 Disorder of bone density and structure, unspecified: Secondary | ICD-10-CM | POA: Diagnosis not present

## 2019-01-08 DIAGNOSIS — R82998 Other abnormal findings in urine: Secondary | ICD-10-CM | POA: Diagnosis not present

## 2019-01-08 DIAGNOSIS — R7301 Impaired fasting glucose: Secondary | ICD-10-CM | POA: Diagnosis not present

## 2019-01-08 DIAGNOSIS — R946 Abnormal results of thyroid function studies: Secondary | ICD-10-CM | POA: Diagnosis not present

## 2019-01-08 DIAGNOSIS — E7849 Other hyperlipidemia: Secondary | ICD-10-CM | POA: Diagnosis not present

## 2019-01-15 DIAGNOSIS — Z1331 Encounter for screening for depression: Secondary | ICD-10-CM | POA: Diagnosis not present

## 2019-01-15 DIAGNOSIS — Z681 Body mass index (BMI) 19 or less, adult: Secondary | ICD-10-CM | POA: Diagnosis not present

## 2019-01-15 DIAGNOSIS — H9193 Unspecified hearing loss, bilateral: Secondary | ICD-10-CM | POA: Diagnosis not present

## 2019-01-15 DIAGNOSIS — R7301 Impaired fasting glucose: Secondary | ICD-10-CM | POA: Diagnosis not present

## 2019-01-15 DIAGNOSIS — D126 Benign neoplasm of colon, unspecified: Secondary | ICD-10-CM | POA: Diagnosis not present

## 2019-01-15 DIAGNOSIS — R946 Abnormal results of thyroid function studies: Secondary | ICD-10-CM | POA: Diagnosis not present

## 2019-01-15 DIAGNOSIS — Z0000 Encounter for general adult medical examination without abnormal findings: Secondary | ICD-10-CM | POA: Diagnosis not present

## 2019-01-15 DIAGNOSIS — C4491 Basal cell carcinoma of skin, unspecified: Secondary | ICD-10-CM | POA: Diagnosis not present

## 2019-01-15 DIAGNOSIS — E7849 Other hyperlipidemia: Secondary | ICD-10-CM | POA: Diagnosis not present

## 2019-01-15 DIAGNOSIS — M859 Disorder of bone density and structure, unspecified: Secondary | ICD-10-CM | POA: Diagnosis not present

## 2019-01-18 ENCOUNTER — Encounter: Payer: Self-pay | Admitting: Gastroenterology

## 2019-01-23 ENCOUNTER — Encounter: Payer: Self-pay | Admitting: Gastroenterology

## 2019-02-22 ENCOUNTER — Ambulatory Visit (AMBULATORY_SURGERY_CENTER): Payer: Self-pay | Admitting: *Deleted

## 2019-02-22 ENCOUNTER — Other Ambulatory Visit: Payer: Self-pay

## 2019-02-22 VITALS — Temp 98.0°F | Ht 63.0 in | Wt 105.6 lb

## 2019-02-22 DIAGNOSIS — Z86010 Personal history of colonic polyps: Secondary | ICD-10-CM

## 2019-02-22 MED ORDER — SUPREP BOWEL PREP KIT 17.5-3.13-1.6 GM/177ML PO SOLN: 1.0000 | Freq: Once | ORAL | 0 refills | Status: AC

## 2019-02-22 NOTE — Progress Notes (Signed)
Patient denies any allergies to egg or soy products. Patient denies complications with anesthesia/sedation.  Patient denies oxygen use at home and denies diet medications. Emmi instructions for colonoscopy explained but patient did not want Emmi.  Pamphlet given.

## 2019-03-08 ENCOUNTER — Encounter: Payer: Medicare Other | Admitting: Gastroenterology

## 2019-08-27 ENCOUNTER — Other Ambulatory Visit: Payer: Self-pay | Admitting: Internal Medicine

## 2019-08-27 DIAGNOSIS — Z1231 Encounter for screening mammogram for malignant neoplasm of breast: Secondary | ICD-10-CM

## 2019-08-30 DIAGNOSIS — Z23 Encounter for immunization: Secondary | ICD-10-CM | POA: Diagnosis not present

## 2019-10-01 ENCOUNTER — Other Ambulatory Visit: Payer: Self-pay

## 2019-10-01 DIAGNOSIS — L820 Inflamed seborrheic keratosis: Secondary | ICD-10-CM | POA: Diagnosis not present

## 2019-10-01 DIAGNOSIS — Z85828 Personal history of other malignant neoplasm of skin: Secondary | ICD-10-CM | POA: Diagnosis not present

## 2019-10-01 DIAGNOSIS — L718 Other rosacea: Secondary | ICD-10-CM | POA: Diagnosis not present

## 2019-10-01 DIAGNOSIS — D485 Neoplasm of uncertain behavior of skin: Secondary | ICD-10-CM | POA: Diagnosis not present

## 2019-10-01 DIAGNOSIS — L821 Other seborrheic keratosis: Secondary | ICD-10-CM | POA: Diagnosis not present

## 2019-10-01 DIAGNOSIS — D0471 Carcinoma in situ of skin of right lower limb, including hip: Secondary | ICD-10-CM | POA: Diagnosis not present

## 2019-10-01 DIAGNOSIS — L570 Actinic keratosis: Secondary | ICD-10-CM | POA: Diagnosis not present

## 2019-10-01 DIAGNOSIS — C44319 Basal cell carcinoma of skin of other parts of face: Secondary | ICD-10-CM | POA: Diagnosis not present

## 2019-10-01 DIAGNOSIS — D1801 Hemangioma of skin and subcutaneous tissue: Secondary | ICD-10-CM | POA: Diagnosis not present

## 2019-10-02 ENCOUNTER — Ambulatory Visit (INDEPENDENT_AMBULATORY_CARE_PROVIDER_SITE_OTHER): Payer: Medicare Other | Admitting: Obstetrics & Gynecology

## 2019-10-02 ENCOUNTER — Encounter: Payer: Self-pay | Admitting: Obstetrics & Gynecology

## 2019-10-02 VITALS — BP 120/82 | Ht 63.0 in | Wt 104.0 lb

## 2019-10-02 DIAGNOSIS — Z780 Asymptomatic menopausal state: Secondary | ICD-10-CM

## 2019-10-02 DIAGNOSIS — M8589 Other specified disorders of bone density and structure, multiple sites: Secondary | ICD-10-CM

## 2019-10-02 DIAGNOSIS — Z01419 Encounter for gynecological examination (general) (routine) without abnormal findings: Secondary | ICD-10-CM

## 2019-10-02 NOTE — Progress Notes (Signed)
Madison [name] 1946/01/26 [id number]   History:    73 y.o. G2P2L2 married  RP:  Established patient presenting for annual gyn exam   HPI: Menopause, well on no HRT.  No PMB.  No pelvic pain.  Abstinent.  Urine and bowel movements normal.  Breasts normal.  Body mass index 18.42.  Very active physically.  Health labs with Dr. Joylene Draft.  Colonoscopy in 2015.   Past medical history,surgical history, family history and social history were all reviewed and documented in the EPIC chart.  Gynecologic History No LMP recorded. Patient is postmenopausal. Contraception: post menopausal status Last Pap: 03/2017. Results were: Negative/HPV HR negative Last mammogram: 09/2018. Results were: Negative. Bone Density: Bone Density Osteopenia.  Followed by Dr Joylene Draft. Colonoscopy: 2015  Obstetric History OB History  Gravida Para Term Preterm AB Living  2 2 2     2   SAB TAB Ectopic Multiple Live Births               # Outcome Date GA Lbr Len/2nd Weight Sex Delivery Anes PTL Lv  2 Term           1 Term              ROS: A ROS was performed and pertinent positives and negatives are included in the history.  GENERAL: No fevers or chills. HEENT: No change in vision, no earache, sore throat or sinus congestion. NECK: No pain or stiffness. CARDIOVASCULAR: No chest pain or pressure. No palpitations. PULMONARY: No shortness of breath, cough or wheeze. GASTROINTESTINAL: No abdominal pain, nausea, vomiting or diarrhea, melena or bright red blood per rectum. GENITOURINARY: No urinary frequency, urgency, hesitancy or dysuria. MUSCULOSKELETAL: No joint or muscle pain, no back pain, no recent trauma. DERMATOLOGIC: No rash, no itching, no lesions. ENDOCRINE: No polyuria, polydipsia, no heat or cold intolerance. No recent change in weight. HEMATOLOGICAL: No anemia or easy bruising or bleeding. NEUROLOGIC: No headache, seizures, numbness, tingling or weakness. PSYCHIATRIC: No depression, no loss of interest in normal  activity or change in sleep pattern.     Exam:   BP 120/82    Ht 5\' 3"  (1.6 m)    Wt 104 lb (47.2 kg)    BMI 18.42 kg/m   Body mass index is 18.42 kg/m.  General appearance : Well developed well nourished female. No acute distress HEENT: Eyes: no retinal hemorrhage or exudates,  Neck supple, trachea midline, no carotid bruits, no thyroidmegaly Lungs: Clear to auscultation, no rhonchi or wheezes, or rib retractions  Heart: Regular rate and rhythm, no murmurs or gallops Breast:Examined in sitting and supine position were symmetrical in appearance, no palpable masses or tenderness,  no skin retraction, no nipple inversion, no nipple discharge, no skin discoloration, no axillary or supraclavicular lymphadenopathy Abdomen: no palpable masses or tenderness, no rebound or guarding Extremities: no edema or skin discoloration or tenderness  Pelvic: Vulva: Normal             Vagina: No gross lesions or discharge  Cervix: No gross lesions or discharge  Uterus AV, normal size, shape and consistency, non-tender and mobile  Adnexa  Without masses or tenderness  Anus: Normal   Assessment/Plan:  73 y.o. female for annual exam   1. Well female exam with routine gynecological exam Normal gynecologic exam in menopause.  Pap test April 2018 was negative with negative high-risk HPV, no indication to repeat this year.  Breast exam normal.  Will schedule a screening mammogram now.  Colonoscopy  in 2015.  Health labs with family physician.  Good body mass index at 18.42.  Good fitness and healthy nutrition.  2. Post-menopause Well on no hormone replacement therapy.  No postmenopausal bleeding.  3. Osteopenia of multiple sites We will continue with vitamin D supplements, calcium and nutrition at 1200 mg daily.  Continue with weightbearing physical activities on a regular basis.  Follow with Dr. Joylene Draft with bone density when due.  Princess Bruins MD, 3:26 PM 10/02/2019

## 2019-10-03 ENCOUNTER — Encounter: Payer: Self-pay | Admitting: Obstetrics & Gynecology

## 2019-10-03 NOTE — Patient Instructions (Signed)
1. Well female exam with routine gynecological exam Normal gynecologic exam in menopause.  Pap test April 2018 was negative with negative high-risk HPV, no indication to repeat this year.  Breast exam normal.  Will schedule a screening mammogram now.  Colonoscopy in 2015.  Health labs with family physician.  Good body mass index at 18.42.  Good fitness and healthy nutrition.  2. Post-menopause Well on no hormone replacement therapy.  No postmenopausal bleeding.  3. Osteopenia of multiple sites We will continue with vitamin D supplements, calcium and nutrition at 1200 mg daily.  Continue with weightbearing physical activities on a regular basis.  Follow with Dr. Joylene Draft with bone density when due.  Madison [name], it was a pleasure seeing you today!

## 2019-10-09 ENCOUNTER — Other Ambulatory Visit: Payer: Self-pay

## 2019-10-09 DIAGNOSIS — Z20822 Contact with and (suspected) exposure to covid-19: Secondary | ICD-10-CM

## 2019-10-10 LAB — NOVEL CORONAVIRUS, NAA: SARS-CoV-2, NAA: NOT DETECTED

## 2019-10-11 ENCOUNTER — Ambulatory Visit: Payer: Medicare Other

## 2019-10-16 ENCOUNTER — Other Ambulatory Visit: Payer: Self-pay

## 2019-10-16 ENCOUNTER — Ambulatory Visit
Admission: RE | Admit: 2019-10-16 | Discharge: 2019-10-16 | Disposition: A | Discharge status: Home / Self Care | Payer: Medicare Other | Source: Ambulatory Visit | Attending: Internal Medicine | Admitting: Internal Medicine

## 2019-10-16 DIAGNOSIS — Z1231 Encounter for screening mammogram for malignant neoplasm of breast: Secondary | ICD-10-CM

## 2019-11-15 DIAGNOSIS — H33311 Horseshoe tear of retina without detachment, right eye: Secondary | ICD-10-CM | POA: Diagnosis not present

## 2019-11-15 DIAGNOSIS — H35412 Lattice degeneration of retina, left eye: Secondary | ICD-10-CM | POA: Diagnosis not present

## 2019-11-15 DIAGNOSIS — H2513 Age-related nuclear cataract, bilateral: Secondary | ICD-10-CM | POA: Diagnosis not present

## 2019-11-15 DIAGNOSIS — H43813 Vitreous degeneration, bilateral: Secondary | ICD-10-CM | POA: Diagnosis not present

## 2019-11-16 DIAGNOSIS — Z85828 Personal history of other malignant neoplasm of skin: Secondary | ICD-10-CM | POA: Diagnosis not present

## 2019-11-16 DIAGNOSIS — C44319 Basal cell carcinoma of skin of other parts of face: Secondary | ICD-10-CM | POA: Diagnosis not present

## 2019-11-23 DIAGNOSIS — Z85828 Personal history of other malignant neoplasm of skin: Secondary | ICD-10-CM | POA: Diagnosis not present

## 2019-11-23 DIAGNOSIS — D0471 Carcinoma in situ of skin of right lower limb, including hip: Secondary | ICD-10-CM | POA: Diagnosis not present

## 2019-11-23 DIAGNOSIS — L738 Other specified follicular disorders: Secondary | ICD-10-CM | POA: Diagnosis not present

## 2019-11-23 DIAGNOSIS — L570 Actinic keratosis: Secondary | ICD-10-CM | POA: Diagnosis not present

## 2019-12-24 ENCOUNTER — Ambulatory Visit: Payer: Medicare Other | Attending: Internal Medicine

## 2019-12-24 DIAGNOSIS — Z23 Encounter for immunization: Secondary | ICD-10-CM | POA: Diagnosis not present

## 2019-12-24 NOTE — Progress Notes (Signed)
   Covid-19 Vaccination Clinic  Name:  Madison [name]    MRN: [id number] DOB: [name] 07, 1947  12/24/2019  Ms. Delude was observed post Covid-19 immunization for 15 minutes without incidence. She was provided with Vaccine Information Sheet and instruction to access the V-Safe system.   Ms. Pies was instructed to call 911 with any severe reactions post vaccine: Marland Kitchen Difficulty breathing  . Swelling of your face and throat  . A fast heartbeat  . A bad rash all over your body  . Dizziness and weakness

## 2020-01-13 ENCOUNTER — Ambulatory Visit: Payer: Medicare Other | Attending: Internal Medicine

## 2020-01-13 DIAGNOSIS — Z23 Encounter for immunization: Secondary | ICD-10-CM | POA: Insufficient documentation

## 2020-01-13 NOTE — Progress Notes (Signed)
   Covid-19 Vaccination Clinic  Name:  Madison [name]    MRN: [id number] DOB: 08/15/1946  01/13/2020  [name] was observed post Covid-19 immunization for 15 minutes without incidence. She was provided with Vaccine Information Sheet and instruction to access the V-Safe system.   [name] was instructed to call 911 with any severe reactions post vaccine: Marland Kitchen Difficulty breathing  . Swelling of your face and throat  . A fast heartbeat  . A bad rash all over your body  . Dizziness and weakness    Immunizations Administered    Name Date Dose VIS Date Route   Pfizer COVID-19 Vaccine 01/13/2020  9:49 AM 0.3 mL 11/23/2019 Intramuscular   Manufacturer: [place]   Lot: [id number]   [place]: [id number]

## 2020-01-14 ENCOUNTER — Encounter: Payer: Self-pay | Admitting: Gastroenterology

## 2020-01-28 DIAGNOSIS — M8589 Other specified disorders of bone density and structure, multiple sites: Secondary | ICD-10-CM | POA: Diagnosis not present

## 2020-01-28 DIAGNOSIS — M8580 Other specified disorders of bone density and structure, unspecified site: Secondary | ICD-10-CM | POA: Diagnosis not present

## 2020-02-11 DIAGNOSIS — R7301 Impaired fasting glucose: Secondary | ICD-10-CM | POA: Diagnosis not present

## 2020-02-11 DIAGNOSIS — R82998 Other abnormal findings in urine: Secondary | ICD-10-CM | POA: Diagnosis not present

## 2020-02-11 DIAGNOSIS — M859 Disorder of bone density and structure, unspecified: Secondary | ICD-10-CM | POA: Diagnosis not present

## 2020-02-11 DIAGNOSIS — R946 Abnormal results of thyroid function studies: Secondary | ICD-10-CM | POA: Diagnosis not present

## 2020-02-11 DIAGNOSIS — E7849 Other hyperlipidemia: Secondary | ICD-10-CM | POA: Diagnosis not present

## 2020-02-14 ENCOUNTER — Ambulatory Visit (AMBULATORY_SURGERY_CENTER): Payer: Self-pay | Admitting: *Deleted

## 2020-02-14 ENCOUNTER — Other Ambulatory Visit: Payer: Self-pay

## 2020-02-14 VITALS — Temp 96.0°F | Ht 63.0 in | Wt 105.0 lb

## 2020-02-14 DIAGNOSIS — Z86010 Personal history of colonic polyps: Secondary | ICD-10-CM

## 2020-02-14 NOTE — Progress Notes (Signed)
No egg or soy allergy known to patient  No issues with past sedation with any surgeries  or procedures, no intubation problems  No diet pills per patient No home 02 use per patient  No blood thinners per patient  Pt denies issues with constipation  No A fib or A flutter  EMMI video sent to pt's e mail   Pt completed covid vaccines 01-13-2020 and she has a Suprep kit at home not expired  Due to the COVID-19 pandemic we are asking patients to follow these guidelines. Please only bring one care partner. Please be aware that your care partner Mobley wait in the car in the parking lot or if they feel like they will be too hot to wait in the car, they Mainer wait in the lobby on the 4th floor. All care partners are required to wear a mask the entire time (we do not have any that we can provide them), they need to practice social distancing, and we will do a Covid check for all patient's and care partners when you arrive. Also we will check their temperature and your temperature. If the care partner waits in their car they need to stay in the parking lot the entire time and we will call them on their cell phone when the patient is ready for discharge so they can bring the car to the front of the building. Also all patient's will need to wear a mask into building.

## 2020-02-18 DIAGNOSIS — G609 Hereditary and idiopathic neuropathy, unspecified: Secondary | ICD-10-CM | POA: Diagnosis not present

## 2020-02-18 DIAGNOSIS — H9190 Unspecified hearing loss, unspecified ear: Secondary | ICD-10-CM | POA: Diagnosis not present

## 2020-02-18 DIAGNOSIS — Z1339 Encounter for screening examination for other mental health and behavioral disorders: Secondary | ICD-10-CM | POA: Diagnosis not present

## 2020-02-18 DIAGNOSIS — Z1331 Encounter for screening for depression: Secondary | ICD-10-CM | POA: Diagnosis not present

## 2020-02-18 DIAGNOSIS — M8580 Other specified disorders of bone density and structure, unspecified site: Secondary | ICD-10-CM | POA: Diagnosis not present

## 2020-02-18 DIAGNOSIS — D126 Benign neoplasm of colon, unspecified: Secondary | ICD-10-CM | POA: Diagnosis not present

## 2020-02-18 DIAGNOSIS — R946 Abnormal results of thyroid function studies: Secondary | ICD-10-CM | POA: Diagnosis not present

## 2020-02-18 DIAGNOSIS — E785 Hyperlipidemia, unspecified: Secondary | ICD-10-CM | POA: Diagnosis not present

## 2020-02-18 DIAGNOSIS — M25569 Pain in unspecified knee: Secondary | ICD-10-CM | POA: Diagnosis not present

## 2020-02-18 DIAGNOSIS — C4491 Basal cell carcinoma of skin, unspecified: Secondary | ICD-10-CM | POA: Diagnosis not present

## 2020-02-18 DIAGNOSIS — Z0000 Encounter for general adult medical examination without abnormal findings: Secondary | ICD-10-CM | POA: Diagnosis not present

## 2020-02-28 ENCOUNTER — Other Ambulatory Visit: Payer: Self-pay

## 2020-02-28 ENCOUNTER — Encounter: Payer: Self-pay | Admitting: Gastroenterology

## 2020-02-28 ENCOUNTER — Ambulatory Visit (AMBULATORY_SURGERY_CENTER): Payer: Medicare Other | Admitting: Gastroenterology

## 2020-02-28 VITALS — BP 118/69 | HR 56 | Temp 98.2°F | Resp 24 | Ht 63.0 in | Wt 105.0 lb

## 2020-02-28 DIAGNOSIS — Z86010 Personal history of colonic polyps: Secondary | ICD-10-CM | POA: Diagnosis not present

## 2020-02-28 DIAGNOSIS — K635 Polyp of colon: Secondary | ICD-10-CM | POA: Diagnosis not present

## 2020-02-28 DIAGNOSIS — D122 Benign neoplasm of ascending colon: Secondary | ICD-10-CM | POA: Diagnosis not present

## 2020-02-28 DIAGNOSIS — D123 Benign neoplasm of transverse colon: Secondary | ICD-10-CM

## 2020-02-28 DIAGNOSIS — D125 Benign neoplasm of sigmoid colon: Secondary | ICD-10-CM

## 2020-02-28 MED ORDER — SODIUM CHLORIDE 0.9 % IV SOLN: 500.0000 mL | Freq: Once | INTRAVENOUS | Status: DC

## 2020-02-28 NOTE — Progress Notes (Signed)
pt tolerated well. VSS. awake and to recovery. Report given to RN.  

## 2020-02-28 NOTE — Op Note (Signed)
[place] Patient Name: Madison [name] Procedure Date: 02/28/2020 10:56 AM MRN: [id number] Endoscopist: Mauri Pole , MD Age: 74 Referring MD:  Date of Birth: 01-01-1946 Gender: Female Account #: [id number] Procedure:                Colonoscopy Indications:              High risk colon cancer surveillance: Personal                            history of colonic polyps, High risk colon cancer                            surveillance: Personal history of adenoma less than                            10 mm in size, High risk colon cancer surveillance:                            Personal history of sessile serrated colon polyp                            (less than 10 mm in size) with no dysplasia Medicines:                Monitored Anesthesia Care Procedure:                Pre-Anesthesia Assessment:                           - Prior to the procedure, a History and Physical                            was performed, and patient medications and                            allergies were reviewed. The patient's tolerance of                            previous anesthesia was also reviewed. The risks                            and benefits of the procedure and the sedation                            options and risks were discussed with the patient.                            All questions were answered, and informed consent                            was obtained. Prior Anticoagulants: The patient has                            taken no previous anticoagulant or antiplatelet  agents. ASA Grade Assessment: II - A patient with                            mild systemic disease. After reviewing the risks                            and benefits, the patient was deemed in                            satisfactory condition to undergo the procedure.                           After obtaining informed consent, the colonoscope                            was passed under  direct vision. Throughout the                            procedure, the patient's blood pressure, pulse, and                            oxygen saturations were monitored continuously. The                            Colonoscope was introduced through the anus and                            advanced to the the cecum, identified by                            appendiceal orifice and ileocecal valve. The                            colonoscopy was performed without difficulty. The                            patient tolerated the procedure well. The quality                            of the bowel preparation was good. The ileocecal                            valve, appendiceal orifice, and rectum were                            photographed. Scope In: 11:02:50 AM Scope Out: 11:35:07 AM Scope Withdrawal Time: 0 hours 19 minutes 54 seconds  Total Procedure Duration: 0 hours 32 minutes 17 seconds  Findings:                 The perianal and digital rectal examinations were                            normal.  Two flat polyps were found in the transverse colon                            and ascending colon. The polyps were 14 to 18 mm in                            size. These polyps were removed with a piecemeal                            technique using a cold snare. Resection and                            retrieval were complete.                           A less than 1 mm polyp was found in the sigmoid                            colon. The polyp was sessile. The polyp was removed                            with a cold biopsy forceps. Resection and retrieval                            were complete.                           Non-bleeding internal hemorrhoids were found during                            retroflexion. The hemorrhoids were small. Complications:            No immediate complications. Estimated Blood Loss:     Estimated blood loss was minimal. Impression:                - Two 14 to 18 mm polyps in the transverse colon                            and in the ascending colon, removed piecemeal using                            a cold snare. Resected and retrieved.                           - One less than 1 mm polyp in the sigmoid colon,                            removed with a cold biopsy forceps. Resected and                            retrieved.                           - Non-bleeding internal hemorrhoids. Recommendation:           -  Patient has a contact number available for                            emergencies. The signs and symptoms of potential                            delayed complications were discussed with the                            patient. Return to normal activities tomorrow.                            Written discharge instructions were provided to the                            patient.                           - Resume previous diet.                           - Continue present medications.                           - Await pathology results.                           - Repeat colonoscopy in 3 years for surveillance                            based on pathology results. Mauri Pole, MD 02/28/2020 11:39:25 AM This report has been signed electronically.

## 2020-02-28 NOTE — Patient Instructions (Signed)
Please read handouts provided. Continue present medications. Await pathology results.   YOU HAD AN ENDOSCOPIC PROCEDURE TODAY AT THE [facility]:   Refer to the procedure report that was given to you for any specific questions about what was found during the examination.  If the procedure report does not answer your questions, please call your gastroenterologist to clarify.  If you requested that your care partner not be given the details of your procedure findings, then the procedure report has been included in a sealed envelope for you to review at your convenience later.  YOU SHOULD EXPECT: Some feelings of bloating in the abdomen. Passage of more gas than usual.  Walking can help get rid of the air that was put into your GI tract during the procedure and reduce the bloating. If you had a lower endoscopy (such as a colonoscopy or flexible sigmoidoscopy) you [place] notice spotting of blood in your stool or on the toilet paper. If you underwent a bowel prep for your procedure, you [place] not have a normal bowel movement for a few days.  Please Note:  You might notice some irritation and congestion in your nose or some drainage.  This is from the oxygen used during your procedure.  There is no need for concern and it should clear up in a day or so.  SYMPTOMS TO REPORT IMMEDIATELY:  Following lower endoscopy (colonoscopy or flexible sigmoidoscopy):  Excessive amounts of blood in the stool  Significant tenderness or worsening of abdominal pains  Swelling of the abdomen that is new, acute  Fever of 100F or higher   For urgent or emergent issues, a gastroenterologist can be reached at any hour by calling [phone]. Do not use MyChart messaging for urgent concerns.    DIET:  We do recommend a small meal at first, but then you F[place] proceed to your regular diet.  Drink plenty of fluids but you should avoid alcoholic beverages for 24 hours.  ACTIVITY:  You should plan to take it easy  for the rest of today and you should NOT DRIVE or use heavy machinery until tomorrow (because of the sedation medicines used during the test).    FOLLOW UP: Our staff will call the number listed on your records 48-72 hours following your procedure to check on you and address any questions or concerns that you [place] have regarding the information given to you following your procedure. If we do not reach you, we will leave a message.  We will attempt to reach you two times.  During this call, we will ask if you have developed any symptoms of COVID 19. If you develop any symptoms (ie: fever, flu-like symptoms, shortness of breath, cough etc.) before then, please call [phone].  If you test positive for Covid 19 in the 2 weeks post procedure, please call and report this information to us.    If any biopsies were taken you will be contacted by phone or by letter within the next 1-3 weeks.  Please call us at [phone] if you have not heard about the biopsies in 3 weeks.    SIGNATURES/CONFIDENTIALITY: You and/or your care partner have signed paperwork which will be entered into your electronic medical record.  These signatures attest to the fact that that the information above on your After Visit Summary has been reviewed and is understood.  Full responsibility of the confidentiality of this discharge information lies with you and/or your care-partner.  

## 2020-02-28 NOTE — Progress Notes (Signed)
Pt's states no medical or surgical changes since previsit or office visit.  Temp LC, VS CW 

## 2020-03-03 ENCOUNTER — Telehealth: Payer: Self-pay

## 2020-03-03 NOTE — Telephone Encounter (Signed)
  Follow up Call-  Call back number 02/28/2020  Post procedure Call Back phone  # [phone]  Permission to leave phone message Yes  Some recent data might be hidden     Patient questions:  Do you have a fever, pain , or abdominal swelling? No. Pain Score  0 *  Have you tolerated food without any problems? Yes.    Have you been able to return to your normal activities? Yes.    Do you have any questions about your discharge instructions: Diet   No. Medications  No. Follow up visit  No.  Do you have questions or concerns about your Care? No.  Actions: * If pain score is 4 or above: No action needed, pain <4. 1. Have you developed a fever since your procedure? no  2.   Have you had an respiratory symptoms (SOB or cough) since your procedure? no  3.   Have you tested positive for COVID 19 since your procedure no  4.   Have you had any family members/close contacts diagnosed with the COVID 19 since your procedure?  no   If yes to any of these questions please route to Joylene John, RN and Alphonsa Gin, Therapist, sports.

## 2020-03-04 ENCOUNTER — Encounter: Payer: Self-pay | Admitting: Gastroenterology

## 2020-08-08 DIAGNOSIS — Z23 Encounter for immunization: Secondary | ICD-10-CM | POA: Diagnosis not present

## 2020-09-01 ENCOUNTER — Other Ambulatory Visit: Payer: Self-pay | Admitting: Internal Medicine

## 2020-09-01 DIAGNOSIS — Z1231 Encounter for screening mammogram for malignant neoplasm of breast: Secondary | ICD-10-CM

## 2020-09-13 DIAGNOSIS — Z23 Encounter for immunization: Secondary | ICD-10-CM | POA: Diagnosis not present

## 2020-10-06 DIAGNOSIS — L821 Other seborrheic keratosis: Secondary | ICD-10-CM | POA: Diagnosis not present

## 2020-10-06 DIAGNOSIS — D2271 Melanocytic nevi of right lower limb, including hip: Secondary | ICD-10-CM | POA: Diagnosis not present

## 2020-10-06 DIAGNOSIS — I788 Other diseases of capillaries: Secondary | ICD-10-CM | POA: Diagnosis not present

## 2020-10-06 DIAGNOSIS — L578 Other skin changes due to chronic exposure to nonionizing radiation: Secondary | ICD-10-CM | POA: Diagnosis not present

## 2020-10-06 DIAGNOSIS — L918 Other hypertrophic disorders of the skin: Secondary | ICD-10-CM | POA: Diagnosis not present

## 2020-10-06 DIAGNOSIS — Z85828 Personal history of other malignant neoplasm of skin: Secondary | ICD-10-CM | POA: Diagnosis not present

## 2020-10-16 ENCOUNTER — Ambulatory Visit
Admission: RE | Admit: 2020-10-16 | Discharge: 2020-10-16 | Disposition: A | Discharge status: Home / Self Care | Payer: Medicare Other | Source: Ambulatory Visit | Attending: Internal Medicine | Admitting: Internal Medicine

## 2020-10-16 ENCOUNTER — Other Ambulatory Visit: Payer: Self-pay

## 2020-10-16 DIAGNOSIS — Z1231 Encounter for screening mammogram for malignant neoplasm of breast: Secondary | ICD-10-CM

## 2020-12-31 ENCOUNTER — Ambulatory Visit (INDEPENDENT_AMBULATORY_CARE_PROVIDER_SITE_OTHER): Payer: Medicare Other | Admitting: Obstetrics & Gynecology

## 2020-12-31 ENCOUNTER — Other Ambulatory Visit: Payer: Self-pay

## 2020-12-31 ENCOUNTER — Encounter: Payer: Self-pay | Admitting: Obstetrics & Gynecology

## 2020-12-31 VITALS — BP 118/76 | Ht 64.75 in | Wt 104.0 lb

## 2020-12-31 DIAGNOSIS — Z01419 Encounter for gynecological examination (general) (routine) without abnormal findings: Secondary | ICD-10-CM | POA: Diagnosis not present

## 2020-12-31 DIAGNOSIS — Z780 Asymptomatic menopausal state: Secondary | ICD-10-CM

## 2020-12-31 DIAGNOSIS — M8589 Other specified disorders of bone density and structure, multiple sites: Secondary | ICD-10-CM

## 2020-12-31 NOTE — Progress Notes (Signed)
Madison [name] 1946-01-14 [id number]   History:    75 y.o. G2P2L72married  EL:[id number] presenting for annual gyn exam   UMP:[id number], well on no HRT.  No PMB.  No pelvic pain.  Pap 2018 Neg. Abstinent. Urine and bowel movements normal. Breasts normal. Screening mammo neg 10/2020. Body mass index 17.44. Very active physically. Colonoscopy in 02/2020.  BD Osteopenia in 2021 with Dr Joylene Draft. Health labs with Dr. Joylene Draft.   Past medical history,surgical history, family history and social history were all reviewed and documented in the EPIC chart.  Gynecologic History No LMP recorded. Patient is postmenopausal.  Obstetric History OB History  Gravida Para Term Preterm AB Living  2 2 2     2   SAB IAB Ectopic Multiple Live Births               # Outcome Date GA Lbr Len/2nd Weight Sex Delivery Anes PTL Lv  2 Term           1 Term              ROS: A ROS was performed and pertinent positives and negatives are included in the history.  GENERAL: No fevers or chills. HEENT: No change in vision, no earache, sore throat or sinus congestion. NECK: No pain or stiffness. CARDIOVASCULAR: No chest pain or pressure. No palpitations. PULMONARY: No shortness of breath, cough or wheeze. GASTROINTESTINAL: No abdominal pain, nausea, vomiting or diarrhea, melena or bright red blood per rectum. GENITOURINARY: No urinary frequency, urgency, hesitancy or dysuria. MUSCULOSKELETAL: No joint or muscle pain, no back pain, no recent trauma. DERMATOLOGIC: No rash, no itching, no lesions. ENDOCRINE: No polyuria, polydipsia, no heat or cold intolerance. No recent change in weight. HEMATOLOGICAL: No anemia or easy bruising or bleeding. NEUROLOGIC: No headache, seizures, numbness, tingling or weakness. PSYCHIATRIC: No depression, no loss of interest in normal activity or change in sleep pattern.     Exam:   Ht 5' 4.75" (1.645 m)   Wt 104 lb (47.2 kg)   BMI 17.44 kg/m   Body mass index is  17.44 kg/m.  General appearance : Well developed well nourished female. No acute distress HEENT: Eyes: no retinal hemorrhage or exudates,  Neck supple, trachea midline, no carotid bruits, no thyroidmegaly Lungs: Clear to auscultation, no rhonchi or wheezes, or rib retractions  Heart: Regular rate and rhythm, no murmurs or gallops Breast:Examined in sitting and supine position were symmetrical in appearance, no palpable masses or tenderness,  no skin retraction, no nipple inversion, no nipple discharge, no skin discoloration, no axillary or supraclavicular lymphadenopathy Abdomen: no palpable masses or tenderness, no rebound or guarding Extremities: no edema or skin discoloration or tenderness  Pelvic: Vulva: Normal             Vagina: No gross lesions or discharge  Cervix: No gross lesions or discharge  Uterus  AV, normal size, shape and consistency, non-tender and mobile  Adnexa  Without masses or tenderness  Anus: Normal   Assessment/Plan:  75 y.o. female for annual exam   1. Well female exam with routine gynecological exam Normal gynecologic exam in menopause.  We will repeat a Pap test at 5 years next year.  Breast exam normal.  Screening mammogram November 2021 was negative.  Colonoscopy March 2021.  BMI 17.44.  Continue with fitness and healthy nutrition.  Recommend a mild increase in calories to avoid weight loss.  2. Post-menopause Well on no HRT.  No PMB.  3. Osteopenia of  multiple sites BD stable Osteopenia in 2021 per patient.  Vitamin D supplements, calcium intake of 1500 mg daily and regular weightbearing physical activities to continue.  Followed by Dr Joylene Draft.  Princess Bruins MD, 2:48 PM 12/31/2020

## 2021-02-04 DIAGNOSIS — H00025 Hordeolum internum left lower eyelid: Secondary | ICD-10-CM | POA: Diagnosis not present

## 2021-02-20 DIAGNOSIS — R7301 Impaired fasting glucose: Secondary | ICD-10-CM | POA: Diagnosis not present

## 2021-02-20 DIAGNOSIS — M859 Disorder of bone density and structure, unspecified: Secondary | ICD-10-CM | POA: Diagnosis not present

## 2021-02-20 DIAGNOSIS — E785 Hyperlipidemia, unspecified: Secondary | ICD-10-CM | POA: Diagnosis not present

## 2021-02-23 DIAGNOSIS — E785 Hyperlipidemia, unspecified: Secondary | ICD-10-CM | POA: Diagnosis not present

## 2021-02-23 DIAGNOSIS — R7301 Impaired fasting glucose: Secondary | ICD-10-CM | POA: Diagnosis not present

## 2021-02-23 DIAGNOSIS — R82998 Other abnormal findings in urine: Secondary | ICD-10-CM | POA: Diagnosis not present

## 2021-02-23 DIAGNOSIS — G629 Polyneuropathy, unspecified: Secondary | ICD-10-CM | POA: Diagnosis not present

## 2021-02-23 DIAGNOSIS — G609 Hereditary and idiopathic neuropathy, unspecified: Secondary | ICD-10-CM | POA: Diagnosis not present

## 2021-02-23 DIAGNOSIS — Z1389 Encounter for screening for other disorder: Secondary | ICD-10-CM | POA: Diagnosis not present

## 2021-02-23 DIAGNOSIS — Z0000 Encounter for general adult medical examination without abnormal findings: Secondary | ICD-10-CM | POA: Diagnosis not present

## 2021-02-23 DIAGNOSIS — M8589 Other specified disorders of bone density and structure, multiple sites: Secondary | ICD-10-CM | POA: Diagnosis not present

## 2021-02-23 DIAGNOSIS — Z23 Encounter for immunization: Secondary | ICD-10-CM | POA: Diagnosis not present

## 2021-02-23 DIAGNOSIS — R202 Paresthesia of skin: Secondary | ICD-10-CM | POA: Diagnosis not present

## 2021-02-24 ENCOUNTER — Other Ambulatory Visit: Payer: Self-pay | Admitting: Internal Medicine

## 2021-02-24 DIAGNOSIS — E785 Hyperlipidemia, unspecified: Secondary | ICD-10-CM

## 2021-03-24 ENCOUNTER — Ambulatory Visit
Admission: RE | Admit: 2021-03-24 | Discharge: 2021-03-24 | Disposition: A | Discharge status: Home / Self Care | Payer: No Typology Code available for payment source | Source: Ambulatory Visit | Attending: Internal Medicine | Admitting: Internal Medicine

## 2021-03-24 DIAGNOSIS — E785 Hyperlipidemia, unspecified: Secondary | ICD-10-CM | POA: Diagnosis not present

## 2021-03-31 DIAGNOSIS — Z23 Encounter for immunization: Secondary | ICD-10-CM | POA: Diagnosis not present

## 2021-05-22 DIAGNOSIS — Z20822 Contact with and (suspected) exposure to covid-19: Secondary | ICD-10-CM | POA: Diagnosis not present

## 2021-05-27 DIAGNOSIS — Z20822 Contact with and (suspected) exposure to covid-19: Secondary | ICD-10-CM | POA: Diagnosis not present

## 2021-08-18 DIAGNOSIS — H6123 Impacted cerumen, bilateral: Secondary | ICD-10-CM | POA: Diagnosis not present

## 2021-09-02 ENCOUNTER — Other Ambulatory Visit: Payer: Self-pay | Admitting: Internal Medicine

## 2021-09-02 DIAGNOSIS — Z1231 Encounter for screening mammogram for malignant neoplasm of breast: Secondary | ICD-10-CM

## 2021-09-17 DIAGNOSIS — Z23 Encounter for immunization: Secondary | ICD-10-CM | POA: Diagnosis not present

## 2021-09-24 DIAGNOSIS — Z23 Encounter for immunization: Secondary | ICD-10-CM | POA: Diagnosis not present

## 2021-10-14 DIAGNOSIS — D0471 Carcinoma in situ of skin of right lower limb, including hip: Secondary | ICD-10-CM | POA: Diagnosis not present

## 2021-10-14 DIAGNOSIS — L570 Actinic keratosis: Secondary | ICD-10-CM | POA: Diagnosis not present

## 2021-10-14 DIAGNOSIS — Z85828 Personal history of other malignant neoplasm of skin: Secondary | ICD-10-CM | POA: Diagnosis not present

## 2021-10-14 DIAGNOSIS — D0439 Carcinoma in situ of skin of other parts of face: Secondary | ICD-10-CM | POA: Diagnosis not present

## 2021-10-14 DIAGNOSIS — L814 Other melanin hyperpigmentation: Secondary | ICD-10-CM | POA: Diagnosis not present

## 2021-10-14 DIAGNOSIS — L718 Other rosacea: Secondary | ICD-10-CM | POA: Diagnosis not present

## 2021-10-14 DIAGNOSIS — L821 Other seborrheic keratosis: Secondary | ICD-10-CM | POA: Diagnosis not present

## 2021-10-15 ENCOUNTER — Other Ambulatory Visit: Payer: Self-pay

## 2021-10-15 ENCOUNTER — Ambulatory Visit (INDEPENDENT_AMBULATORY_CARE_PROVIDER_SITE_OTHER): Payer: Medicare Other | Admitting: Psychologist

## 2021-10-15 DIAGNOSIS — F320 Major depressive disorder, single episode, mild: Secondary | ICD-10-CM

## 2021-10-19 ENCOUNTER — Other Ambulatory Visit: Payer: Self-pay

## 2021-10-19 ENCOUNTER — Ambulatory Visit (INDEPENDENT_AMBULATORY_CARE_PROVIDER_SITE_OTHER): Payer: Medicare Other | Admitting: Psychologist

## 2021-10-19 ENCOUNTER — Ambulatory Visit
Admission: RE | Admit: 2021-10-19 | Discharge: 2021-10-19 | Disposition: A | Discharge status: Home / Self Care | Payer: Medicare Other | Source: Ambulatory Visit | Attending: Internal Medicine | Admitting: Internal Medicine

## 2021-10-19 DIAGNOSIS — Z1231 Encounter for screening mammogram for malignant neoplasm of breast: Secondary | ICD-10-CM

## 2021-10-19 DIAGNOSIS — F320 Major depressive disorder, single episode, mild: Secondary | ICD-10-CM | POA: Diagnosis not present

## 2021-11-10 ENCOUNTER — Other Ambulatory Visit: Payer: Self-pay

## 2021-11-10 ENCOUNTER — Ambulatory Visit (INDEPENDENT_AMBULATORY_CARE_PROVIDER_SITE_OTHER): Payer: Medicare Other | Admitting: Psychologist

## 2021-11-10 DIAGNOSIS — F320 Major depressive disorder, single episode, mild: Secondary | ICD-10-CM

## 2021-12-17 ENCOUNTER — Ambulatory Visit: Payer: Medicare Other | Admitting: Psychologist

## 2021-12-22 ENCOUNTER — Ambulatory Visit (INDEPENDENT_AMBULATORY_CARE_PROVIDER_SITE_OTHER): Payer: Medicare Other | Admitting: Psychologist

## 2021-12-22 ENCOUNTER — Other Ambulatory Visit: Payer: Self-pay

## 2021-12-22 DIAGNOSIS — F320 Major depressive disorder, single episode, mild: Secondary | ICD-10-CM

## 2021-12-22 NOTE — Progress Notes (Signed)
[facility] Counselor/Therapist Progress Note  Patient ID: Madison [name], MRN: [id number],    Date: 12/22/2021  Time Spent: 2:00 pm to 2:23 pm; total time: 23 minutes   This session was held via in person. The patient consented to in-person therapy and was in the clinician's office. Limits of confidentiality were discussed with the patient.   Treatment Type: Individual Therapy  Reported Symptoms: Patient described self as doing well and denied experiencing depressive symptoms.   Mental Status Exam: Appearance:  Meticulous     Behavior: Appropriate  Motor: Normal  Speech/Language:  Normal Rate  Affect: Appropriate  Mood: normal  Thought process: normal  Thought content:   WNL  Sensory/Perceptual disturbances:   WNL  Orientation: oriented to person, place, and time/date  Attention: Good  Concentration: Good  Memory: WNL  Fund of knowledge:  Good  Insight:   Good  Judgment:  Good  Impulse Control: Good   Risk Assessment: Danger to Self:  No Self-injurious Behavior: No Danger to Others: No Duty to Warn:no Physical Aggression / Violence:No  Access to Firearms a concern: No  Gang Involvement:No   Subjective: Beginning the session, patient described herself as doing well while reflecting on events since the last session. Elaborating, she talked about the holidays and how she and Madison [name] have upcoming travel plans. Continuing to talk, she stated that for the time it appears as though Madison [name] has stabilized, which means the patient is also more stable. She processed emotions and thoughts she was experiencing. She talked about what she has learned about herself. She acknowledged that for the time since Madison [name] is doing better that she does not need counseling now, however recognizes that she will need it down the road. She stated she would reach out when she needs services. She denied suicidal and homicidal ideation.    Interventions:  Worked on developing a therapeutic  relationship with the patient using active listening and reflective statements. Provided emotional support using empathy and validation. Reviewed events since the last session. Praised the patient for doing well and explored what has assisted the patient. Processed expressed thoughts and emotions. Used socratic questions to assist the patient gain insight into self. Explored what steps patient was taking to implement self-care. Reflected on what patient has learned about herself in counseling thus far. Normalized patient's current experience caring for someone with a chronic health condition. Discussed next steps for the patient. Processed how patient could get in contact with clinician. Assessed for suicidal and homicidal ideation.  Homework: NA  Next Session: NA  Diagnosis: F32.0 major depressive affective disorder, single episode, mild   Plan:   Client Abilities: Friendly and easy to develop rapport  Client Preferences: ACT  Client statement of Needs: Emotional support, and process spouse health declining  Treatment Level: Outpatient   Goals Alleviate depressive symptoms Recognize, accept, and cope with depressive feelings Develop healthy thinking patterns Develop healthy interpersonal relationships  Objectives target date for objectives is 10/15/2022 Cooperate with a medication evaluation by a physician Verbalize an accurate understanding of depression Verbalize an understanding of the treatment Identify and replace thoughts that support depression Learn and implement behavioral strategies Verbalize an understanding and resolution of current interpersonal problems Learn and implement problem solving and decision making skills Learn and implement conflict resolution skills to resolve interpersonal problems Verbalize an understanding of healthy and unhealthy emotions verbalize insight into how past relationships Shreeve be influence current experiences with depression Use mindfulness  and acceptance strategies and increase value based  behavior  Increase hopeful statements about the future.  Interventions Consistent with treatment model, discuss how change in cognitive, behavioral, and interpersonal can help client alleviate depression CBT Behavioral activation help the client explore the relationship, nature of the dispute,  Help the client develop new interpersonal skills and relationships Conduct Problem so living therapy Teach conflict resolution skills Use a process-experiential approach Conduct TLDP Conduct ACT  The patient and clinician reviewed the treatment plan on 10/19/2021. The patient approved of the treatment plan.   Conception Chancy, PsyD

## 2022-01-01 ENCOUNTER — Encounter: Payer: Self-pay | Admitting: Obstetrics & Gynecology

## 2022-01-01 ENCOUNTER — Other Ambulatory Visit: Payer: Self-pay

## 2022-01-01 ENCOUNTER — Ambulatory Visit (INDEPENDENT_AMBULATORY_CARE_PROVIDER_SITE_OTHER): Payer: Medicare Other | Admitting: Obstetrics & Gynecology

## 2022-01-01 VITALS — BP 104/64 | HR 74 | Resp 16 | Ht 63.25 in | Wt 104.0 lb

## 2022-01-01 DIAGNOSIS — Z780 Asymptomatic menopausal state: Secondary | ICD-10-CM

## 2022-01-01 DIAGNOSIS — M810 Age-related osteoporosis without current pathological fracture: Secondary | ICD-10-CM

## 2022-01-01 DIAGNOSIS — M8589 Other specified disorders of bone density and structure, multiple sites: Secondary | ICD-10-CM

## 2022-01-01 DIAGNOSIS — Z9189 Other specified personal risk factors, not elsewhere classified: Secondary | ICD-10-CM

## 2022-01-01 DIAGNOSIS — Z01419 Encounter for gynecological examination (general) (routine) without abnormal findings: Secondary | ICD-10-CM

## 2022-01-01 NOTE — Progress Notes (Signed)
Madison [name] [birth year] [id number]   History:    76 y.o. G2P2L2 married   RP:  Established patient presenting for annual gyn exam    HPI: Postmenopause, well on no HRT.  No PMB.  No pelvic pain.  Pap 2018 Neg.  Abstinent.  Urine and bowel movements normal.  Breasts normal. Screening mammo neg 10/2021.  Body mass index 18.28.  Very active physically.  Colonoscopy in 02/2020.  BD Osteopenia in 2021 with Dr Joylene Draft, will repeat this year.  Health labs with Dr. Joylene Draft.     Past medical history,surgical history, family history and social history were all reviewed and documented in the EPIC chart.  Gynecologic History No LMP recorded. Patient is postmenopausal.  Obstetric History OB History  Gravida Para Term Preterm AB Living  2 2 2     2   SAB IAB Ectopic Multiple Live Births               # Outcome Date GA Lbr Len/2nd Weight Sex Delivery Anes PTL Lv  2 Term           1 Term              ROS: A ROS was performed and pertinent positives and negatives are included in the history.  GENERAL: No fevers or chills. HEENT: No change in vision, no earache, sore throat or sinus congestion. NECK: No pain or stiffness. CARDIOVASCULAR: No chest pain or pressure. No palpitations. PULMONARY: No shortness of breath, cough or wheeze. GASTROINTESTINAL: No abdominal pain, nausea, vomiting or diarrhea, melena or bright red blood per rectum. GENITOURINARY: No urinary frequency, urgency, hesitancy or dysuria. MUSCULOSKELETAL: No joint or muscle pain, no back pain, no recent trauma. DERMATOLOGIC: No rash, no itching, no lesions. ENDOCRINE: No polyuria, polydipsia, no heat or cold intolerance. No recent change in weight. HEMATOLOGICAL: No anemia or easy bruising or bleeding. NEUROLOGIC: No headache, seizures, numbness, tingling or weakness. PSYCHIATRIC: No depression, no loss of interest in normal activity or change in sleep pattern.     Exam:   BP 104/64    Pulse 74    Resp 16    Ht 5' 3.25" (1.607 m)    Wt  104 lb (47.2 kg)    BMI 18.28 kg/m   Body mass index is 18.28 kg/m.  General appearance : Well developed well nourished female. No acute distress HEENT: Eyes: no retinal hemorrhage or exudates,  Neck supple, trachea midline, no carotid bruits, no thyroidmegaly Lungs: Clear to auscultation, no rhonchi or wheezes, or rib retractions  Heart: Regular rate and rhythm, no murmurs or gallops Breast:Examined in sitting and supine position were symmetrical in appearance, no palpable masses or tenderness,  no skin retraction, no nipple inversion, no nipple discharge, no skin discoloration, no axillary or supraclavicular lymphadenopathy Abdomen: no palpable masses or tenderness, no rebound or guarding Extremities: no edema or skin discoloration or tenderness  Pelvic: Vulva: Normal             Vagina: No gross lesions or discharge  Cervix: No gross lesions or discharge  Uterus  AV, normal size, shape and consistency, non-tender and mobile  Adnexa  Without masses or tenderness  Anus: Normal   Assessment/Plan:  76 y.o. female for annual exam   1. Well female exam with routine gynecological exam Postmenopause, well on no HRT.  No PMB.  No pelvic pain.  Pap 2018 Neg.  Abstinent.  Urine and bowel movements normal.  Breasts normal. Screening mammo neg  10/2021.  Body mass index 18.28.  Very active physically.  Colonoscopy in 02/2020.  BD Osteopenia in 2021 with Dr Joylene Draft, will repeat this year.  Health labs with Dr. Joylene Draft.    2. At risk of fracture due to osteopenia  3. Post-menopause Postmenopause, well on no HRT.  No PMB.  No pelvic pain.  Abstinent.   4. Osteopenia of multiple sites Followed by Dr Joylene Draft.  BD this year.  Other orders - rosuvastatin (CRESTOR) 5 MG tablet; Take 5 mg by mouth daily.   Princess Bruins MD, 2:34 PM 01/01/2022

## 2022-01-05 DIAGNOSIS — D0439 Carcinoma in situ of skin of other parts of face: Secondary | ICD-10-CM | POA: Diagnosis not present

## 2022-01-05 DIAGNOSIS — Z85828 Personal history of other malignant neoplasm of skin: Secondary | ICD-10-CM | POA: Diagnosis not present

## 2022-03-02 DIAGNOSIS — L720 Epidermal cyst: Secondary | ICD-10-CM | POA: Diagnosis not present

## 2022-03-02 DIAGNOSIS — Z85828 Personal history of other malignant neoplasm of skin: Secondary | ICD-10-CM | POA: Diagnosis not present

## 2022-03-02 DIAGNOSIS — L570 Actinic keratosis: Secondary | ICD-10-CM | POA: Diagnosis not present

## 2022-04-06 DIAGNOSIS — R946 Abnormal results of thyroid function studies: Secondary | ICD-10-CM | POA: Diagnosis not present

## 2022-04-06 DIAGNOSIS — M859 Disorder of bone density and structure, unspecified: Secondary | ICD-10-CM | POA: Diagnosis not present

## 2022-04-06 DIAGNOSIS — E785 Hyperlipidemia, unspecified: Secondary | ICD-10-CM | POA: Diagnosis not present

## 2022-04-06 DIAGNOSIS — R7301 Impaired fasting glucose: Secondary | ICD-10-CM | POA: Diagnosis not present

## 2022-04-06 DIAGNOSIS — R82998 Other abnormal findings in urine: Secondary | ICD-10-CM | POA: Diagnosis not present

## 2022-04-06 DIAGNOSIS — M8589 Other specified disorders of bone density and structure, multiple sites: Secondary | ICD-10-CM | POA: Diagnosis not present

## 2022-04-06 DIAGNOSIS — I2510 Atherosclerotic heart disease of native coronary artery without angina pectoris: Secondary | ICD-10-CM | POA: Diagnosis not present

## 2022-04-06 DIAGNOSIS — Z79899 Other long term (current) drug therapy: Secondary | ICD-10-CM | POA: Diagnosis not present

## 2022-04-13 DIAGNOSIS — Z1339 Encounter for screening examination for other mental health and behavioral disorders: Secondary | ICD-10-CM | POA: Diagnosis not present

## 2022-04-13 DIAGNOSIS — I2510 Atherosclerotic heart disease of native coronary artery without angina pectoris: Secondary | ICD-10-CM | POA: Diagnosis not present

## 2022-04-13 DIAGNOSIS — M8580 Other specified disorders of bone density and structure, unspecified site: Secondary | ICD-10-CM | POA: Diagnosis not present

## 2022-04-13 DIAGNOSIS — G629 Polyneuropathy, unspecified: Secondary | ICD-10-CM | POA: Diagnosis not present

## 2022-04-13 DIAGNOSIS — Z1331 Encounter for screening for depression: Secondary | ICD-10-CM | POA: Diagnosis not present

## 2022-04-13 DIAGNOSIS — G609 Hereditary and idiopathic neuropathy, unspecified: Secondary | ICD-10-CM | POA: Diagnosis not present

## 2022-04-13 DIAGNOSIS — R7301 Impaired fasting glucose: Secondary | ICD-10-CM | POA: Diagnosis not present

## 2022-04-13 DIAGNOSIS — Z0000 Encounter for general adult medical examination without abnormal findings: Secondary | ICD-10-CM | POA: Diagnosis not present

## 2022-05-03 DIAGNOSIS — Z23 Encounter for immunization: Secondary | ICD-10-CM | POA: Diagnosis not present

## 2022-08-20 DIAGNOSIS — Z23 Encounter for immunization: Secondary | ICD-10-CM | POA: Diagnosis not present

## 2022-09-08 ENCOUNTER — Other Ambulatory Visit: Payer: Self-pay | Admitting: Internal Medicine

## 2022-09-08 DIAGNOSIS — Z1231 Encounter for screening mammogram for malignant neoplasm of breast: Secondary | ICD-10-CM

## 2022-09-13 DIAGNOSIS — Z23 Encounter for immunization: Secondary | ICD-10-CM | POA: Diagnosis not present

## 2022-09-28 DIAGNOSIS — D485 Neoplasm of uncertain behavior of skin: Secondary | ICD-10-CM | POA: Diagnosis not present

## 2022-09-28 DIAGNOSIS — C44729 Squamous cell carcinoma of skin of left lower limb, including hip: Secondary | ICD-10-CM | POA: Diagnosis not present

## 2022-09-28 DIAGNOSIS — Z85828 Personal history of other malignant neoplasm of skin: Secondary | ICD-10-CM | POA: Diagnosis not present

## 2022-10-20 ENCOUNTER — Ambulatory Visit
Admission: RE | Admit: 2022-10-20 | Discharge: 2022-10-20 | Disposition: A | Discharge status: Home / Self Care | Payer: Medicare Other | Source: Ambulatory Visit | Attending: Internal Medicine | Admitting: Internal Medicine

## 2022-10-20 DIAGNOSIS — Z1231 Encounter for screening mammogram for malignant neoplasm of breast: Secondary | ICD-10-CM

## 2022-12-16 DIAGNOSIS — H2513 Age-related nuclear cataract, bilateral: Secondary | ICD-10-CM | POA: Diagnosis not present

## 2022-12-16 DIAGNOSIS — H35412 Lattice degeneration of retina, left eye: Secondary | ICD-10-CM | POA: Diagnosis not present

## 2022-12-16 DIAGNOSIS — H33311 Horseshoe tear of retina without detachment, right eye: Secondary | ICD-10-CM | POA: Diagnosis not present

## 2022-12-16 DIAGNOSIS — H43813 Vitreous degeneration, bilateral: Secondary | ICD-10-CM | POA: Diagnosis not present

## 2022-12-22 DIAGNOSIS — L570 Actinic keratosis: Secondary | ICD-10-CM | POA: Diagnosis not present

## 2022-12-22 DIAGNOSIS — Z85828 Personal history of other malignant neoplasm of skin: Secondary | ICD-10-CM | POA: Diagnosis not present

## 2022-12-22 DIAGNOSIS — C44719 Basal cell carcinoma of skin of left lower limb, including hip: Secondary | ICD-10-CM | POA: Diagnosis not present

## 2022-12-22 DIAGNOSIS — L821 Other seborrheic keratosis: Secondary | ICD-10-CM | POA: Diagnosis not present

## 2022-12-22 DIAGNOSIS — D485 Neoplasm of uncertain behavior of skin: Secondary | ICD-10-CM | POA: Diagnosis not present

## 2022-12-30 DIAGNOSIS — H25813 Combined forms of age-related cataract, bilateral: Secondary | ICD-10-CM | POA: Diagnosis not present

## 2022-12-30 DIAGNOSIS — H33311 Horseshoe tear of retina without detachment, right eye: Secondary | ICD-10-CM | POA: Diagnosis not present

## 2022-12-30 DIAGNOSIS — H35412 Lattice degeneration of retina, left eye: Secondary | ICD-10-CM | POA: Diagnosis not present

## 2022-12-30 DIAGNOSIS — H43813 Vitreous degeneration, bilateral: Secondary | ICD-10-CM | POA: Diagnosis not present

## 2023-02-10 ENCOUNTER — Encounter: Payer: Self-pay | Admitting: Obstetrics & Gynecology

## 2023-02-10 ENCOUNTER — Ambulatory Visit (INDEPENDENT_AMBULATORY_CARE_PROVIDER_SITE_OTHER): Payer: Medicare Other | Admitting: Obstetrics & Gynecology

## 2023-02-10 VITALS — BP 102/60 | HR 76 | Resp 16 | Ht 63.25 in | Wt 103.0 lb

## 2023-02-10 DIAGNOSIS — Z01419 Encounter for gynecological examination (general) (routine) without abnormal findings: Secondary | ICD-10-CM

## 2023-02-10 DIAGNOSIS — Z780 Asymptomatic menopausal state: Secondary | ICD-10-CM

## 2023-02-10 DIAGNOSIS — M8589 Other specified disorders of bone density and structure, multiple sites: Secondary | ICD-10-CM

## 2023-02-10 NOTE — Progress Notes (Signed)
Tangila Nelle Sayres December 12, 1946 [id number]   History:    77 y.o. G2P2L2 Widowed   RP:  Established patient presenting for annual gyn exam    HPI: Postmenopause, well on no HRT.  No PMB.  No pelvic pain.  Pap 2018 Neg.  Abstinent.  Urine and bowel movements normal.  Breasts normal. Screening mammo neg 10/2022.  Body mass index 18.10.  Very active physically. Colonoscopy in 02/2020.  BD Osteopenia in 2021 with Dr Joylene Draft.  Health labs with Dr. Joylene Draft.   Past medical history,surgical history, family history and social history were all reviewed and documented in the EPIC chart.  Gynecologic History No LMP recorded. Patient is postmenopausal.  Obstetric History OB History  Gravida Para Term Preterm AB Living  '2 2 2     2  '$ SAB IAB Ectopic Multiple Live Births               # Outcome Date GA Lbr Len/2nd Weight Sex Delivery Anes PTL Lv  2 Term           1 Term              ROS: A ROS was performed and pertinent positives and negatives are included in the history. GENERAL: No fevers or chills. HEENT: No change in vision, no earache, sore throat or sinus congestion. NECK: No pain or stiffness. CARDIOVASCULAR: No chest pain or pressure. No palpitations. PULMONARY: No shortness of breath, cough or wheeze. GASTROINTESTINAL: No abdominal pain, nausea, vomiting or diarrhea, melena or bright red blood per rectum. GENITOURINARY: No urinary frequency, urgency, hesitancy or dysuria. MUSCULOSKELETAL: No joint or muscle pain, no back pain, no recent trauma. DERMATOLOGIC: No rash, no itching, no lesions. ENDOCRINE: No polyuria, polydipsia, no heat or cold intolerance. No recent change in weight. HEMATOLOGICAL: No anemia or easy bruising or bleeding. NEUROLOGIC: No headache, seizures, numbness, tingling or weakness. PSYCHIATRIC: No depression, no loss of interest in normal activity or change in sleep pattern.     Exam:  BP 102/60.  Pulse 76.  RR 16. Ht 5' 3.25" (1.607 m)   Wt 103 lb (46.7 kg)   BMI 18.10 kg/m    Body mass index is 18.1 kg/m.  General appearance : Well developed well nourished female. No acute distress HEENT: Eyes: no retinal hemorrhage or exudates,  Neck supple, trachea midline, no carotid bruits, no thyroidmegaly Lungs: Clear to auscultation, no rhonchi or wheezes, or rib retractions  Heart: Regular rate and rhythm, no murmurs or gallops Breast:Examined in sitting and supine position were symmetrical in appearance, no palpable masses or tenderness,  no skin retraction, no nipple inversion, no nipple discharge, no skin discoloration, no axillary or supraclavicular lymphadenopathy Abdomen: no palpable masses or tenderness, no rebound or guarding Extremities: no edema or skin discoloration or tenderness  Pelvic: Vulva: Normal             Vagina: No gross lesions or discharge  Cervix: No gross lesions or discharge  Uterus  AV, normal size, shape and consistency, non-tender and mobile  Adnexa  Without masses or tenderness  Anus: Normal   Assessment/Plan:  77 y.o. female for annual exam   1. Well female exam with routine gynecological exam Postmenopause, well on no HRT.  No PMB.  No pelvic pain.  Pap 2018 Neg.  Abstinent.  Urine and bowel movements normal.  Breasts normal. Screening mammo neg 10/2022.  Body mass index 18.10.  Very active physically. Colonoscopy in 02/2020.  BD Osteopenia in 2021 with  Dr Joylene Draft.  Health labs with Dr. Joylene Draft.   2. Post-menopause Postmenopause, well on no HRT.  No PMB.  No pelvic pain.  3. Osteopenia of multiple sites ody mass index 18.10.  Very active physically.  BD Osteopenia in 2021 with Dr Joylene Draft.  Other orders - Turmeric 400 MG CAPS; take two capsules by mouth daily Oral   Princess Bruins MD, 1:53 PM

## 2023-03-05 DIAGNOSIS — Z23 Encounter for immunization: Secondary | ICD-10-CM | POA: Diagnosis not present

## 2023-04-13 ENCOUNTER — Encounter: Payer: Self-pay | Admitting: Gastroenterology

## 2023-04-22 ENCOUNTER — Encounter: Payer: Self-pay | Admitting: Gastroenterology

## 2023-04-28 DIAGNOSIS — H5203 Hypermetropia, bilateral: Secondary | ICD-10-CM | POA: Diagnosis not present

## 2023-04-28 DIAGNOSIS — H52223 Regular astigmatism, bilateral: Secondary | ICD-10-CM | POA: Diagnosis not present

## 2023-04-28 DIAGNOSIS — H524 Presbyopia: Secondary | ICD-10-CM | POA: Diagnosis not present

## 2023-05-11 DIAGNOSIS — R7301 Impaired fasting glucose: Secondary | ICD-10-CM | POA: Diagnosis not present

## 2023-05-11 DIAGNOSIS — R946 Abnormal results of thyroid function studies: Secondary | ICD-10-CM | POA: Diagnosis not present

## 2023-05-11 DIAGNOSIS — E785 Hyperlipidemia, unspecified: Secondary | ICD-10-CM | POA: Diagnosis not present

## 2023-05-11 DIAGNOSIS — M8580 Other specified disorders of bone density and structure, unspecified site: Secondary | ICD-10-CM | POA: Diagnosis not present

## 2023-05-11 DIAGNOSIS — Z0000 Encounter for general adult medical examination without abnormal findings: Secondary | ICD-10-CM | POA: Diagnosis not present

## 2023-05-18 DIAGNOSIS — Z0000 Encounter for general adult medical examination without abnormal findings: Secondary | ICD-10-CM | POA: Diagnosis not present

## 2023-05-18 DIAGNOSIS — R7301 Impaired fasting glucose: Secondary | ICD-10-CM | POA: Diagnosis not present

## 2023-05-18 DIAGNOSIS — R0781 Pleurodynia: Secondary | ICD-10-CM | POA: Diagnosis not present

## 2023-05-18 DIAGNOSIS — M8580 Other specified disorders of bone density and structure, unspecified site: Secondary | ICD-10-CM | POA: Diagnosis not present

## 2023-05-18 DIAGNOSIS — M25569 Pain in unspecified knee: Secondary | ICD-10-CM | POA: Diagnosis not present

## 2023-05-18 DIAGNOSIS — C4491 Basal cell carcinoma of skin, unspecified: Secondary | ICD-10-CM | POA: Diagnosis not present

## 2023-05-18 DIAGNOSIS — N3090 Cystitis, unspecified without hematuria: Secondary | ICD-10-CM | POA: Diagnosis not present

## 2023-05-18 DIAGNOSIS — R82998 Other abnormal findings in urine: Secondary | ICD-10-CM | POA: Diagnosis not present

## 2023-05-18 DIAGNOSIS — R946 Abnormal results of thyroid function studies: Secondary | ICD-10-CM | POA: Diagnosis not present

## 2023-05-18 DIAGNOSIS — I2510 Atherosclerotic heart disease of native coronary artery without angina pectoris: Secondary | ICD-10-CM | POA: Diagnosis not present

## 2023-05-18 DIAGNOSIS — G609 Hereditary and idiopathic neuropathy, unspecified: Secondary | ICD-10-CM | POA: Diagnosis not present

## 2023-05-18 DIAGNOSIS — E785 Hyperlipidemia, unspecified: Secondary | ICD-10-CM | POA: Diagnosis not present

## 2023-05-18 DIAGNOSIS — G629 Polyneuropathy, unspecified: Secondary | ICD-10-CM | POA: Diagnosis not present

## 2023-05-23 ENCOUNTER — Encounter: Payer: Self-pay | Admitting: Neurology

## 2023-06-22 DIAGNOSIS — L570 Actinic keratosis: Secondary | ICD-10-CM | POA: Diagnosis not present

## 2023-06-22 DIAGNOSIS — L650 Telogen effluvium: Secondary | ICD-10-CM | POA: Diagnosis not present

## 2023-06-22 DIAGNOSIS — L738 Other specified follicular disorders: Secondary | ICD-10-CM | POA: Diagnosis not present

## 2023-06-22 DIAGNOSIS — Z85828 Personal history of other malignant neoplasm of skin: Secondary | ICD-10-CM | POA: Diagnosis not present

## 2023-06-22 DIAGNOSIS — L603 Nail dystrophy: Secondary | ICD-10-CM | POA: Diagnosis not present

## 2023-06-29 ENCOUNTER — Ambulatory Visit (INDEPENDENT_AMBULATORY_CARE_PROVIDER_SITE_OTHER): Payer: Medicare Other | Admitting: Neurology

## 2023-06-29 ENCOUNTER — Encounter: Payer: Self-pay | Admitting: Neurology

## 2023-06-29 VITALS — BP 146/83 | HR 70 | Ht 63.25 in | Wt 103.0 lb

## 2023-06-29 DIAGNOSIS — G629 Polyneuropathy, unspecified: Secondary | ICD-10-CM

## 2023-06-29 NOTE — Patient Instructions (Addendum)
Check labs  Try to cut back on wine  Check feet daily to be sure there is no injury (cuts, scrapes) that need attention  I will see you back in 1 year

## 2023-06-29 NOTE — Progress Notes (Signed)
[facility] Clinic Note - Initial Visit   Date: 06/29/2023   Madison [name] MRN: [id number] DOB: [birth year]   Dear Dr. Waynard Edwards:  Thank you for your kind referral of Madison [name] for consultation of feet numbness. Although her history is well known to you, please allow [place] to reiterate it for the purpose of our medical record. The patient was accompanied to the clinic by self.   Madison [name] is a 77 y.o. right-handed female with hyperlipidemia and osteopenia presenting for evaluation of bilateral feet numbness.   IMPRESSION/PLAN: Probable mild and very distal neuropathy affecting the feet.  Risk factor: alcohol. Exam is relatively normal with intact distal strength and mildly reduced distal reflexes.  I had extensive discussion with the patient regarding the pathogenesis, etiology, management, and natural course of neuropathy. Neuropathy tends to be slowly progressive, especially if a underlying etiology is not controlled.  I would like to test for treatable causes of neuropathy. I also suggest that she try to reduce the amount of wine she drinks.    Check vitamin B12, folate, vitamin B1, SPEP with IFE, copper NCS/EMG was offered and mutually decided to hold on testing unless symptoms get worse Patient educated on daily foot inspection, fall prevention, and safety precautions around the home.  Return to clinic in 1 year  ------------------------------------------------------------- History of present illness: Starting around 2022, she began having numbness and tingling involving the soles of the feet.  Symptoms are constant and annoying.  There is no associated feet pain, imbalance, weakness.  She denies low back or radicular leg pain.    She drinks 2 glasses of wine nightly x many years.  A bottle generally lasts her two days.  Nonsmoker.    Out-side paper records, electronic medical record, and images have been reviewed where available and summarized as:   n/a   Past Medical History:  Diagnosis Date   Allergy    Anemia    "long ago", no current problems   Arthritis    hands   Atrophic vaginitis    Osteopenia     Past Surgical History:  Procedure Laterality Date   BREAST SURGERY Bilateral    Biopsy's x 4 (2 on left and 2 on right-benign)   COLONOSCOPY     POLYPECTOMY     TONSILLECTOMY AND ADENOIDECTOMY     WISDOM TOOTH EXTRACTION       Medications:  Outpatient Encounter Medications as of 06/29/2023  Medication Sig   Ascorbic Acid (VITAMIN C) 1000 MG tablet Take 1,000 mg by mouth daily.   b complex vitamins tablet Take 1 tablet by mouth daily.   Biotin 10 MG TABS Take by mouth.   Cholecalciferol (VITAMIN D PO) Take 2,000 Units by mouth. Reported on 12/22/2015   raloxifene (EVISTA) 60 MG tablet Take 1 tablet (60 mg total) by mouth daily.   rosuvastatin (CRESTOR) 5 MG tablet Take 5 mg by mouth daily.   Turmeric 400 MG CAPS take two capsules by mouth daily Oral   No facility-administered encounter medications on file as of 06/29/2023.    Allergies: No Known Allergies  Family History: Family History  Problem Relation Age of Onset   Breast cancer Mother        Age 9   Hypertension Father    Heart disease Father    Cancer Sister        Non Hodgkin's   Colon cancer Neg Hx    Rectal cancer Neg Hx  Stomach cancer Neg Hx    Colon polyps Neg Hx    Esophageal cancer Neg Hx     Social History: Social History   Tobacco Use   Smoking status: Former   Smokeless tobacco: Never  Vaping Use   Vaping status: Never Used  Substance Use Topics   Alcohol use: Yes    Alcohol/week: 14.0 standard drinks of alcohol    Types: 14 Glasses of wine per week    Comment: 2 glasses wine/night   Drug use: No   Social History   Social History Narrative   Are you right handed or left handed? Right Handed    Are you currently employed ? No   What is your current occupation? None   Do you live at home alone? Yes, husband recently  passes   Who lives with you? Alone   What type of home do you live in: 1 story or 2 story? Lives in a two story home        Vital Signs:  BP (!) 146/83   Pulse 70   Ht 5' 3.25" (1.607 m)   Wt 103 lb (46.7 kg)   SpO2 100%   BMI 18.10 kg/m    Neurological Exam: MENTAL STATUS including orientation to time, place, person, recent and remote memory, attention span and concentration, language, and fund of knowledge is normal.  Speech is not dysarthric.  CRANIAL NERVES: II:  No visual field defects.     III-IV-VI: Pupils equal round and reactive to light.  Normal conjugate, extra-ocular eye movements in all directions of gaze.  No nystagmus.  No ptosis.   V:  Normal facial sensation.    VII:  Normal facial symmetry and movements.   VIII:  Normal hearing and vestibular function.   IX-X:  Normal palatal movement.   XI:  Normal shoulder shrug and head rotation.   XII:  Normal tongue strength and range of motion, no deviation or fasciculation.  MOTOR:  No atrophy, fasciculations or abnormal movements.  No pronator drift.   Upper Extremity:  Right  Left  Deltoid  5/5   5/5   Biceps  5/5   5/5   Triceps  5/5   5/5   Wrist extensors  5/5   5/5   Wrist flexors  5/5   5/5   Finger extensors  5/5   5/5   Finger flexors  5/5   5/5   Dorsal interossei  5/5   5/5   Abductor pollicis  5/5   5/5   Tone (Ashworth scale)  0  0   Lower Extremity:  Right  Left  Hip flexors  5/5   5/5   Knee flexors  5/5   5/5   Knee extensors  5/5   5/5   Dorsiflexors  5/5   5/5   Plantarflexors  5/5   5/5   Toe extensors  5/5   5/5   Toe flexors  5/5   5/5   Tone (Ashworth scale)  0  0   MSRs:                                           Right        Left brachioradialis 2+  2+  biceps 2+  2+  triceps 2+  2+  patellar 2+  2+  ankle jerk 1+  1+  Hoffman no  no  plantar response down  down   SENSORY:  Normal and symmetric perception of light touch, pinprick, vibration, and proprioception.  Romberg's  sign absent.   COORDINATION/GAIT: Normal finger-to- nose-finger.  Intact rapid alternating movements bilaterally.  Able to rise from a chair without using arms.  Gait narrow based and stable. Tandem and stressed gait intact.     Thank you for allowing me to participate in patient's care.  If I can answer any additional questions, I would be pleased to do so.    Sincerely,    Fate Galanti K. Allena Katz, DO

## 2023-07-01 ENCOUNTER — Ambulatory Visit (AMBULATORY_SURGERY_CENTER): Payer: Medicare Other

## 2023-07-01 VITALS — Ht 63.25 in | Wt 103.0 lb

## 2023-07-01 DIAGNOSIS — Z86010 Personal history of colonic polyps: Secondary | ICD-10-CM

## 2023-07-01 MED ORDER — NA SULFATE-K SULFATE-MG SULF 17.5-3.13-1.6 GM/177ML PO SOLN: 1.0000 | Freq: Once | ORAL | 0 refills | Status: AC

## 2023-07-01 NOTE — Progress Notes (Signed)
No egg or soy allergy known to patient  No issues known to pt with past sedation with any surgeries or procedures Patient denies ever being told they had issues or difficulty with intubation  No FH of Malignant Hyperthermia Pt is not on diet pills Pt is not on  home 02  Pt is not on blood thinners  Pt denies issues with constipation  No A fib or A flutter Have any cardiac testing pending--no Pt can ambulate  Pt denies use of chewing tobacco Discussed diabetic I weight loss medication holds Discussed NSAID holds Checked BMI Pt instructed to use Singlecare.com or GoodRx for a price reduction on prep  Patient's chart reviewed by Cathlyn Parsons CNRA prior to previsit and patient appropriate for the [facility].  Pre visit completed and red dot placed by patient's name on their procedure day (on provider's schedule).    Ambulate independently

## 2023-07-27 DIAGNOSIS — I788 Other diseases of capillaries: Secondary | ICD-10-CM | POA: Diagnosis not present

## 2023-07-27 DIAGNOSIS — Z419 Encounter for procedure for purposes other than remedying health state, unspecified: Secondary | ICD-10-CM | POA: Diagnosis not present

## 2023-07-27 DIAGNOSIS — Z85828 Personal history of other malignant neoplasm of skin: Secondary | ICD-10-CM | POA: Diagnosis not present

## 2023-07-27 DIAGNOSIS — L570 Actinic keratosis: Secondary | ICD-10-CM | POA: Diagnosis not present

## 2023-07-28 ENCOUNTER — Ambulatory Visit (AMBULATORY_SURGERY_CENTER): Payer: Medicare Other | Admitting: Gastroenterology

## 2023-07-28 ENCOUNTER — Encounter: Payer: Self-pay | Admitting: Gastroenterology

## 2023-07-28 VITALS — BP 96/44 | HR 72 | Temp 97.3°F | Resp 31 | Ht 63.25 in | Wt 103.0 lb

## 2023-07-28 DIAGNOSIS — Z09 Encounter for follow-up examination after completed treatment for conditions other than malignant neoplasm: Secondary | ICD-10-CM | POA: Diagnosis not present

## 2023-07-28 DIAGNOSIS — Z86010 Personal history of colonic polyps: Secondary | ICD-10-CM | POA: Diagnosis not present

## 2023-07-28 MED ORDER — SODIUM CHLORIDE 0.9 % IV SOLN: 500.0000 mL | Freq: Once | INTRAVENOUS | Status: DC

## 2023-07-28 NOTE — Progress Notes (Signed)
[facility] History and Physical   Primary Care Physician:  Rodrigo Ran, MD   Reason for Procedure:  History of adenomatous colon polyps  Plan:    Surveillance colonoscopy with possible interventions as needed     HPI: Madison [name] is a very pleasant 77 y.o. female here for surveillance colonoscopy. Denies any nausea, vomiting, abdominal pain, melena or bright red blood per rectum  The risks and benefits as well as alternatives of endoscopic procedure(s) have been discussed and reviewed. All questions answered. The patient agrees to proceed.    Past Medical History:  Diagnosis Date   Allergy    Anemia    "long ago", no current problems   Arthritis    hands   Atrophic vaginitis    Osteopenia     Past Surgical History:  Procedure Laterality Date   BREAST SURGERY Bilateral    Biopsy's x 4 (2 on left and 2 on right-benign)   COLONOSCOPY     POLYPECTOMY     TONSILLECTOMY AND ADENOIDECTOMY     WISDOM TOOTH EXTRACTION      Prior to Admission medications   Medication Sig Start Date End Date Taking? Authorizing Provider  Ascorbic Acid (VITAMIN C) 1000 MG tablet Take 1,000 mg by mouth daily.   Yes [provider]  b complex vitamins tablet Take 1 tablet by mouth daily.   Yes [provider]  Biotin 10 MG TABS Take by mouth.   Yes [provider]  Cholecalciferol (VITAMIN D PO) Take 2,000 Units by mouth. Reported on 12/22/2015   Yes [provider]  raloxifene (EVISTA) 60 MG tablet Take 1 tablet (60 mg total) by mouth daily. 06/19/12  Yes Gottsegen, Rande Brunt, MD  SODIUM FLUORIDE 5000 SENSITIVE 1.1-5 % GEL Take by mouth. 03/21/23  Yes [provider]  Turmeric 400 MG CAPS take two capsules by mouth daily Oral 02/18/20  Yes [provider]  rosuvastatin (CRESTOR) 5 MG tablet Take 5 mg by mouth daily.    [provider]    Current Outpatient Medications  Medication Sig Dispense Refill   Ascorbic Acid (VITAMIN  C) 1000 MG tablet Take 1,000 mg by mouth daily.     b complex vitamins tablet Take 1 tablet by mouth daily.     Biotin 10 MG TABS Take by mouth.     Cholecalciferol (VITAMIN D PO) Take 2,000 Units by mouth. Reported on 12/22/2015     raloxifene (EVISTA) 60 MG tablet Take 1 tablet (60 mg total) by mouth daily. 30 tablet 12   SODIUM FLUORIDE 5000 SENSITIVE 1.1-5 % GEL Take by mouth.     Turmeric 400 MG CAPS take two capsules by mouth daily Oral     rosuvastatin (CRESTOR) 5 MG tablet Take 5 mg by mouth daily.     Current Facility-Administered Medications  Medication Dose Route Frequency Provider Last Rate Last Admin   0.9 %  sodium chloride infusion  500 mL Intravenous Once Napoleon Form, MD        Allergies as of 07/28/2023   (No Known Allergies)    Family History  Problem Relation Age of Onset   Breast cancer Mother        Age 71   Hypertension Father    Heart disease Father    Cancer Sister        Non Hodgkin's   Colon cancer Neg Hx    Rectal cancer Neg Hx    Stomach cancer Neg Hx  Colon polyps Neg Hx    Esophageal cancer Neg Hx     Social History   Socioeconomic History   Marital status: Widowed    Spouse name: Not on file   Number of children: Not on file   Years of education: Not on file   Highest education level: Not on file  Occupational History   Not on file  Tobacco Use   Smoking status: Former    Types: Cigarettes   Smokeless tobacco: Never  Vaping Use   Vaping status: Never Used  Substance and Sexual Activity   Alcohol use: Yes    Alcohol/week: 14.0 standard drinks of alcohol    Types: 14 Glasses of wine per week    Comment: 2 glasses wine/night   Drug use: No   Sexual activity: Not Currently    Partners: Male    Birth control/protection: Post-menopausal    Comment: older than 16, less than 5  Other Topics Concern   Not on file  Social History Narrative   Are you right handed or left handed? Right Handed    Are you currently employed ? No    What is your current occupation? None   Do you live at home alone? Yes, husband recently passes   Who lives with you? Alone   What type of home do you live in: 1 story or 2 story? Lives in a two story home       Social Determinants of Health   Financial Resource Strain: Not on file  Food Insecurity: Not on file  Transportation Needs: Not on file  Physical Activity: Not on file  Stress: Not on file  Social Connections: Not on file  Intimate Partner Violence: Not on file    Review of Systems:  All other review of systems negative except as mentioned in the HPI.  Physical Exam: Vital signs in last 24 hours: BP 124/76   Pulse 88   Temp (!) 97.3 F (36.3 C) (Temporal)   Resp 20   Ht 5' 3.25" (1.607 m)   Wt 103 lb (46.7 kg)   SpO2 98%   BMI 18.10 kg/m  General:   Alert, NAD Lungs:  Clear .   Heart:  Regular rate and rhythm Abdomen:  Soft, nontender and nondistended. Neuro/Psych:  Alert and cooperative. Normal mood and affect. A and O x 3  Reviewed labs, radiology imaging, old records and pertinent past GI work up  Patient is appropriate for planned procedure(s) and anesthesia in an ambulatory setting   K. Scherry Ran , MD [phone]

## 2023-07-28 NOTE — Progress Notes (Signed)
Sedate, gd SR, tolerated procedure well, VSS, report to RN 

## 2023-07-28 NOTE — Op Note (Signed)
[facility] Patient Name: Madison [name] Procedure Date: 07/28/2023 7:52 AM MRN: [id number] Endoscopist: Napoleon Form , MD, [id number] Age: 77 Referring MD:  Date of Birth: January 05, 1946 Gender: Female Account #: [id number] Procedure:                Colonoscopy Indications:              High risk colon cancer surveillance: Personal                            history of colonic polyps, High risk colon cancer                            surveillance: Personal history of adenoma (10 mm or                            greater in size), High risk colon cancer                            surveillance: Personal history of multiple (3 or                            more) adenomas Procedure:                Pre-Anesthesia Assessment:                           - Prior to the procedure, a History and Physical                            was performed, and patient medications and                            allergies were reviewed. The patient's tolerance of                            previous anesthesia was also reviewed. The risks                            and benefits of the procedure and the sedation                            options and risks were discussed with the patient.                            All questions were answered, and informed consent                            was obtained. Prior Anticoagulants: The patient has                            taken no anticoagulant or antiplatelet agents. ASA                            Grade Assessment: I - A normal, healthy patient.  After reviewing the risks and benefits, the patient                            was deemed in satisfactory condition to undergo the                            procedure.                           After obtaining informed consent, the colonoscope                            was passed under direct vision. Throughout the                            procedure, the patient's blood pressure,  pulse, and                            oxygen saturations were monitored continuously. The                            Olympus Scope SN: [id number] was introduced through                            the anus and advanced to the the terminal ileum,                            with identification of the appendiceal orifice and                            IC valve. The colonoscopy was performed without                            difficulty. The patient tolerated the procedure                            well. The quality of the bowel preparation was                            excellent. The ileocecal valve, appendiceal                            orifice, and rectum were photographed. Scope In: 8:13:22 AM Scope Out: 8:29:30 AM Scope Withdrawal Time: 0 hours 6 minutes 51 seconds  Total Procedure Duration: 0 hours 16 minutes 8 seconds  Findings:                 The perianal and digital rectal examinations were                            normal.                           Non-bleeding internal hemorrhoids were found during  retroflexion. The hemorrhoids were small.                           The exam was otherwise without abnormality. Complications:            No immediate complications. Estimated Blood Loss:     Estimated blood loss: none. Impression:               - Non-bleeding internal hemorrhoids.                           - The examination was otherwise normal.                           - No specimens collected. Recommendation:           - Patient has a contact number available for                            emergencies. The signs and symptoms of potential                            delayed complications were discussed with the                            patient. Return to normal activities tomorrow.                            Written discharge instructions were provided to the                            patient.                           - Resume previous diet.                            - Continue present medications.                           - No repeat colonoscopy due to age. Napoleon Form, MD 07/28/2023 8:39:17 AM This report has been signed electronically.

## 2023-07-28 NOTE — Patient Instructions (Signed)
Handout on hemorrhoids given to patient Resume previous diet and continue present medications No repeat colonoscopy for surveillance unless new problems    YOU HAD AN ENDOSCOPIC PROCEDURE TODAY AT THE [facility]:   Refer to the procedure report that was given to you for any specific questions about what was found during the examination.  If the procedure report does not answer your questions, please call your gastroenterologist to clarify.  If you requested that your care partner not be given the details of your procedure findings, then the procedure report has been included in a sealed envelope for you to review at your convenience later.  YOU SHOULD EXPECT: Some feelings of bloating in the abdomen. Passage of more gas than usual.  Walking can help get rid of the air that was put into your GI tract during the procedure and reduce the bloating. If you had a lower endoscopy (such as a colonoscopy or flexible sigmoidoscopy) you [place] notice spotting of blood in your stool or on the toilet paper. If you underwent a bowel prep for your procedure, you Neilan not have a normal bowel movement for a few days.  Please Note:  You might notice some irritation and congestion in your nose or some drainage.  This is from the oxygen used during your procedure.  There is no need for concern and it should clear up in a day or so.  SYMPTOMS TO REPORT IMMEDIATELY:  Following lower endoscopy (colonoscopy or flexible sigmoidoscopy):  Excessive amounts of blood in the stool  Significant tenderness or worsening of abdominal pains  Swelling of the abdomen that is new, acute  Fever of 100F or higher  For urgent or emergent issues, a gastroenterologist can be reached at any hour by calling [phone]. Do not use MyChart messaging for urgent concerns.    DIET:  We do recommend a small meal at first, but then you Beamer proceed to your regular diet.  Drink plenty of fluids but you should avoid alcoholic  beverages for 24 hours.  ACTIVITY:  You should plan to take it easy for the rest of today and you should NOT DRIVE or use heavy machinery until tomorrow (because of the sedation medicines used during the test).    FOLLOW UP: Our staff will call the number listed on your records the next business day following your procedure.  We will call around 7:15- 8:00 am to check on you and address any questions or concerns that you Stamp have regarding the information given to you following your procedure. If we do not reach you, we will leave a message.     If any biopsies were taken you will be contacted by phone or by letter within the next 1-3 weeks.  Please call us at [phone] if you have not heard about the biopsies in 3 weeks.    SIGNATURES/CONFIDENTIALITY: You and/or your care partner have signed paperwork which will be entered into your electronic medical record.  These signatures attest to the fact that that the information above on your After Visit Summary has been reviewed and is understood.  Full responsibility of the confidentiality of this discharge information lies with you and/or your care-partner.

## 2023-07-28 NOTE — Progress Notes (Signed)
Pt's states no medical or surgical changes since previsit or office visit. 

## 2023-07-29 ENCOUNTER — Telehealth: Payer: Self-pay

## 2023-07-29 NOTE — Telephone Encounter (Signed)
  Follow up Call-     07/28/2023    7:13 AM  Call back number  Post procedure Call Back phone  # [phone]  Permission to leave phone message Yes     Patient questions:  Do you have a fever, pain , or abdominal swelling? No. Pain Score  0 *  Have you tolerated food without any problems? Yes.    Have you been able to return to your normal activities? Yes.    Do you have any questions about your discharge instructions: Diet   No. Medications  No. Follow up visit  No.  Do you have questions or concerns about your Care? No.  Actions: * If pain score is 4 or above: No action needed, pain <4.

## 2023-08-03 DIAGNOSIS — G629 Polyneuropathy, unspecified: Secondary | ICD-10-CM | POA: Diagnosis not present

## 2023-08-12 LAB — PROTEIN ELECTROPHORESIS, SERUM
Abnormal Protein Band1: 0.8 g/dL — ABNORMAL HIGH
Albumin ELP: 4.3 g/dL (ref 3.8–4.8)
Alpha 1: 0.2 g/dL (ref 0.2–0.3)
Alpha 2: 0.5 g/dL (ref 0.5–0.9)
Beta 2: 1 g/dL — ABNORMAL HIGH (ref 0.2–0.5)
Beta Globulin: 0.4 g/dL (ref 0.4–0.6)
Gamma Globulin: 0.4 g/dL — ABNORMAL LOW (ref 0.8–1.7)
Total Protein: 6.7 g/dL (ref 6.1–8.1)

## 2023-08-12 LAB — IMMUNOFIXATION ELECTROPHORESIS
IgG (Immunoglobin G), Serum: 1213 mg/dL (ref 600–1540)
IgM, Serum: 29 mg/dL — ABNORMAL LOW (ref 50–300)
Immunoglobulin A: 98 mg/dL (ref 70–320)

## 2023-08-12 LAB — VITAMIN B1: Vitamin B1 (Thiamine): 16 nmol/L (ref 8–30)

## 2023-08-12 LAB — FOLATE: Folate: 20.3 ng/mL

## 2023-08-12 LAB — COPPER, SERUM: Copper: 121 ug/dL (ref 70–175)

## 2023-08-17 ENCOUNTER — Other Ambulatory Visit: Payer: Self-pay

## 2023-08-17 DIAGNOSIS — D472 Monoclonal gammopathy: Secondary | ICD-10-CM

## 2023-08-17 NOTE — Addendum Note (Signed)
Addended by: Karl Luke A on: 08/17/2023 03:18 PM   Modules accepted: Orders

## 2023-08-24 DIAGNOSIS — L718 Other rosacea: Secondary | ICD-10-CM | POA: Diagnosis not present

## 2023-08-24 DIAGNOSIS — Z85828 Personal history of other malignant neoplasm of skin: Secondary | ICD-10-CM | POA: Diagnosis not present

## 2023-08-24 DIAGNOSIS — L821 Other seborrheic keratosis: Secondary | ICD-10-CM | POA: Diagnosis not present

## 2023-08-24 DIAGNOSIS — L570 Actinic keratosis: Secondary | ICD-10-CM | POA: Diagnosis not present

## 2023-09-06 ENCOUNTER — Other Ambulatory Visit: Payer: Self-pay | Admitting: Internal Medicine

## 2023-09-06 DIAGNOSIS — Z1231 Encounter for screening mammogram for malignant neoplasm of breast: Secondary | ICD-10-CM

## 2023-09-13 DIAGNOSIS — Z23 Encounter for immunization: Secondary | ICD-10-CM | POA: Diagnosis not present

## 2023-09-21 ENCOUNTER — Other Ambulatory Visit: Payer: Self-pay | Admitting: Internal Medicine

## 2023-09-21 ENCOUNTER — Other Ambulatory Visit: Payer: Self-pay

## 2023-09-21 DIAGNOSIS — D472 Monoclonal gammopathy: Secondary | ICD-10-CM

## 2023-09-22 ENCOUNTER — Other Ambulatory Visit: Payer: Self-pay | Admitting: Internal Medicine

## 2023-09-22 ENCOUNTER — Inpatient Hospital Stay: Payer: Medicare Other

## 2023-09-22 ENCOUNTER — Inpatient Hospital Stay: Payer: Medicare Other | Attending: Internal Medicine | Admitting: Internal Medicine

## 2023-09-22 VITALS — BP 156/70 | HR 62 | Temp 97.5°F | Resp 17 | Ht 63.25 in | Wt 101.8 lb

## 2023-09-22 DIAGNOSIS — Z803 Family history of malignant neoplasm of breast: Secondary | ICD-10-CM | POA: Insufficient documentation

## 2023-09-22 DIAGNOSIS — Z87891 Personal history of nicotine dependence: Secondary | ICD-10-CM | POA: Diagnosis not present

## 2023-09-22 DIAGNOSIS — Z807 Family history of other malignant neoplasms of lymphoid, hematopoietic and related tissues: Secondary | ICD-10-CM | POA: Diagnosis not present

## 2023-09-22 DIAGNOSIS — E785 Hyperlipidemia, unspecified: Secondary | ICD-10-CM | POA: Diagnosis not present

## 2023-09-22 DIAGNOSIS — G629 Polyneuropathy, unspecified: Secondary | ICD-10-CM | POA: Diagnosis not present

## 2023-09-22 DIAGNOSIS — Z79899 Other long term (current) drug therapy: Secondary | ICD-10-CM | POA: Insufficient documentation

## 2023-09-22 DIAGNOSIS — D472 Monoclonal gammopathy: Secondary | ICD-10-CM | POA: Insufficient documentation

## 2023-09-22 LAB — CBC WITH DIFFERENTIAL (CANCER CENTER ONLY)
Abs Immature Granulocytes: 0.01 10*3/uL (ref 0.00–0.07)
Basophils Absolute: 0 10*3/uL (ref 0.0–0.1)
Basophils Relative: 1 %
Eosinophils Absolute: 0.1 10*3/uL (ref 0.0–0.5)
Eosinophils Relative: 2 %
HCT: 40.8 % (ref 36.0–46.0)
Hemoglobin: 13.5 g/dL (ref 12.0–15.0)
Immature Granulocytes: 0 %
Lymphocytes Relative: 39 %
Lymphs Abs: 2.4 10*3/uL (ref 0.7–4.0)
MCH: 31.3 pg (ref 26.0–34.0)
MCHC: 33.1 g/dL (ref 30.0–36.0)
MCV: 94.7 fL (ref 80.0–100.0)
Monocytes Absolute: 0.5 10*3/uL (ref 0.1–1.0)
Monocytes Relative: 8 %
Neutro Abs: 3.1 10*3/uL (ref 1.7–7.7)
Neutrophils Relative %: 50 %
Platelet Count: 242 10*3/uL (ref 150–400)
RBC: 4.31 MIL/uL (ref 3.87–5.11)
RDW: 12 % (ref 11.5–15.5)
WBC Count: 6.2 10*3/uL (ref 4.0–10.5)
nRBC: 0 % (ref 0.0–0.2)

## 2023-09-22 LAB — CMP (CANCER CENTER ONLY)
ALT: 16 U/L (ref 0–44)
AST: 23 U/L (ref 15–41)
Albumin: 4.3 g/dL (ref 3.5–5.0)
Alkaline Phosphatase: 68 U/L (ref 38–126)
Anion gap: 6 (ref 5–15)
BUN: 18 mg/dL (ref 8–23)
CO2: 31 mmol/L (ref 22–32)
Calcium: 9.4 mg/dL (ref 8.9–10.3)
Chloride: 102 mmol/L (ref 98–111)
Creatinine: 0.81 mg/dL (ref 0.44–1.00)
GFR, Estimated: >60 mL/min (ref >60)
Glucose, Bld: 105 mg/dL — ABNORMAL HIGH (ref 70–99)
Potassium: 3.8 mmol/L (ref 3.5–5.1)
Sodium: 139 mmol/L (ref 135–145)
Total Bilirubin: 0.5 mg/dL (ref 0.3–1.2)
Total Protein: 7.3 g/dL (ref 6.5–8.1)

## 2023-09-22 LAB — LACTATE DEHYDROGENASE: LDH: 140 U/L (ref 98–192)

## 2023-09-22 NOTE — Progress Notes (Signed)
[facility] [phone]   [phone]  CONSULT NOTE  REFERRING PHYSICIAN: Dr. Nita Sickle  REASON FOR CONSULTATION:  77 years old white female with M spike.  HPI Madison [name] is a 77 y.o. female came to the clinic today for evaluation of monoclonal gammopathy.Discussed the use of AI scribe software for clinical note transcription with the patient, who gave verbal consent to proceed.  History of Present Illness   The patient, a 77 year old white female with a history of peripheral neuropathy, presents for evaluation of an incidental finding of a small spike of monoclonal protein (0.8) on a recent serum protein electrophoresis. The patient was initially evaluated by a neurologist, Dr. Allena Katz, for complaints of peripheral neuropathy localized to the feet. The patient attributes the onset of these symptoms to the stress of her spouse's illness and subsequent passing within the past year. The neuropathy does not appear to interfere with the patient's daily activities, which include regular exercise such as running and walking. The patient does not report any exacerbation of symptoms at night.  The patient denies any other significant symptoms, including chest pain, shortness of breath, cough, nausea, vomiting, diarrhea, constipation, headache, or visual changes. There has been no recent weight loss, except during the period of her spouse's illness when the patient was the primary caregiver. The patient reports a healthy diet and moderate alcohol consumption, specifically three glasses of wine in the evening, recently switched to non-alcoholic wine.  The patient's past medical history includes allergies, osteoarthritis, osteopenia, and coronary artery disease. The patient denies any history of diabetes, heart attacks, stroke, high cholesterol, or high blood pressure. The patient has a family history of breast cancer in the mother and non-Hodgkin lymphoma in a sibling.  The patient's father passed away at the age of 44 due to arteriosclerosis. The patient is a widow with two adult children. The patient is a former smoker, with smoking limited to college years, and denies any use of street drugs. The patient reports no known drug allergies.  The patient was prescribed Crestor 5mg  by her primary care physician, Dr. Rodrigo Ran, but decided against taking it due to concerns about side effects. The patient's overall health status appears to be stable, with the primary concern being the incidental finding of elevated protein levels in the blood. Further investigation is underway to determine the significance of this finding.      HPI  Past Medical History:  Diagnosis Date   Allergy    Anemia    "long ago", no current problems   Arthritis    hands   Atrophic vaginitis    Osteopenia     Past Surgical History:  Procedure Laterality Date   BREAST SURGERY Bilateral    Biopsy's x 4 (2 on left and 2 on right-benign)   COLONOSCOPY     POLYPECTOMY     TONSILLECTOMY AND ADENOIDECTOMY     WISDOM TOOTH EXTRACTION      Family History  Problem Relation Age of Onset   Breast cancer Mother        Age 68   Hypertension Father    Heart disease Father    Cancer Sister        Non Hodgkin's   Colon cancer Neg Hx    Rectal cancer Neg Hx    Stomach cancer Neg Hx    Colon polyps Neg Hx    Esophageal cancer Neg Hx     Social History Social History  Tobacco Use   Smoking status: Former    Types: Cigarettes   Smokeless tobacco: Never  Vaping Use   Vaping status: Never Used  Substance Use Topics   Alcohol use: Yes    Alcohol/week: 14.0 standard drinks of alcohol    Types: 14 Glasses of wine per week    Comment: 2 glasses wine/night   Drug use: No    No Known Allergies  Current Outpatient Medications  Medication Sig Dispense Refill   Ascorbic Acid (VITAMIN C) 1000 MG tablet Take 1,000 mg by mouth daily.     b complex vitamins tablet Take 1 tablet by  mouth daily.     Biotin 10 MG TABS Take by mouth.     Cholecalciferol (VITAMIN D PO) Take 2,000 Units by mouth. Reported on 12/22/2015     raloxifene (EVISTA) 60 MG tablet Take 1 tablet (60 mg total) by mouth daily. 30 tablet 12   rosuvastatin (CRESTOR) 5 MG tablet Take 5 mg by mouth daily.     SODIUM FLUORIDE 5000 SENSITIVE 1.1-5 % GEL Take by mouth.     Turmeric 400 MG CAPS take two capsules by mouth daily Oral     No current facility-administered medications for this visit.    Review of Systems  Constitutional: negative Eyes: negative Ears, nose, mouth, throat, and face: negative Respiratory: negative Cardiovascular: negative Gastrointestinal: negative Genitourinary:negative Integument/breast: negative Hematologic/lymphatic: negative Musculoskeletal:negative Neurological: positive for paresthesia Behavioral/Psych: negative Endocrine: negative Allergic/Immunologic: negative  Physical Exam  ZOX:WRUEA, healthy, no distress, well nourished, and well developed SKIN: skin color, texture, turgor are normal, no rashes or significant lesions HEAD: Normocephalic, No masses, lesions, tenderness or abnormalities EYES: normal, PERRLA, Conjunctiva are pink and non-injected EARS: External ears normal, Canals clear OROPHARYNX:no exudate, no erythema, and lips, buccal mucosa, and tongue normal  NECK: supple, no adenopathy, no JVD LYMPH:  no palpable lymphadenopathy, no hepatosplenomegaly BREAST:not examined LUNGS: clear to auscultation , and palpation HEART: regular rate & rhythm, no murmurs, and no gallops ABDOMEN:abdomen soft, non-tender, normal bowel sounds, and no masses or organomegaly BACK: Back symmetric, no curvature., No CVA tenderness EXTREMITIES:no joint deformities, effusion, or inflammation, no edema  NEURO: alert & oriented x 3 with fluent speech, no focal motor/sensory deficits  PERFORMANCE STATUS: ECOG 1  LABORATORY DATA: Lab Results  Component Value Date   WBC 6.2  09/22/2023   HGB 13.5 09/22/2023   HCT 40.8 09/22/2023   MCV 94.7 09/22/2023   PLT 242 09/22/2023      Chemistry      Component Value Date/Time   NA 139 09/22/2023 1105   K 3.8 09/22/2023 1105   CL 102 09/22/2023 1105   CO2 31 09/22/2023 1105   BUN 18 09/22/2023 1105   CREATININE 0.81 09/22/2023 1105      Component Value Date/Time   CALCIUM 9.4 09/22/2023 1105   ALKPHOS 68 09/22/2023 1105   AST 23 09/22/2023 1105   ALT 16 09/22/2023 1105   BILITOT 0.5 09/22/2023 1105       RADIOGRAPHIC STUDIES: No results found.  ASSESSMENT AND PLAN: This is a very pleasant 77 years old white female who came for evaluation of M spike on serum protein electrophoresis blood work by her neurologist.    Monoclonal Gammopathy of Undetermined Significance (MGUS) Small spike of 0.8 in monoclonal protein detected on serum protein electrophoresis. No significant abnormalities on quantitative immunoglobulin. Patient asymptomatic with no weight loss or other concerning symptoms. -Complete myeloma panel to further investigate the elevated protein. -If  results are concerning, schedule follow-up appointment. If results are not concerning, patient does not need to return to the cancer center.  Peripheral Neuropathy Patient experiences neuropathy in feet. No other significant neurological symptoms reported. -No changes to current management plan.  Hyperlipidemia Patient was prescribed Crestor 5mg  by primary care physician, but has chosen not to take it due to concerns about side effects. -No changes to current management plan.   The patient was advised to call immediately if she has any other concerning symptoms in the interval.  The patient voices understanding of current disease status and treatment options and is in agreement with the current care plan.  All questions were answered. The patient knows to call the clinic with any problems, questions or concerns. We can certainly see the patient much  sooner if necessary.  Thank you so much for allowing me to participate in the care of Madison [name]. I will continue to follow up the patient with you and assist in her care.  The total time spent in the appointment was 60 minutes.  Disclaimer: This note was dictated with voice recognition software. Similar sounding words can inadvertently be transcribed and [employer] not be corrected upon review.   Lajuana Matte September 22, 2023, 12:03 PM

## 2023-09-23 LAB — KAPPA/LAMBDA LIGHT CHAINS
Kappa free light chain: 7.6 mg/L (ref 3.3–19.4)
Kappa, lambda light chain ratio: 1.52 (ref 0.26–1.65)
Lambda free light chains: 5 mg/L — ABNORMAL LOW (ref 5.7–26.3)

## 2023-09-23 LAB — IGG, IGA, IGM
IgA: 91 mg/dL (ref 64–422)
IgG (Immunoglobin G), Serum: 1227 mg/dL (ref 586–1602)
IgM (Immunoglobulin M), Srm: 26 mg/dL (ref 26–217)

## 2023-09-23 LAB — BETA 2 MICROGLOBULIN, SERUM: Beta-2 Microglobulin: 1.7 mg/L (ref 0.6–2.4)

## 2023-10-25 ENCOUNTER — Ambulatory Visit
Admission: RE | Admit: 2023-10-25 | Discharge: 2023-10-25 | Disposition: A | Discharge status: Home / Self Care | Payer: Medicare Other | Source: Ambulatory Visit | Attending: Internal Medicine | Admitting: Internal Medicine

## 2023-10-25 DIAGNOSIS — Z1231 Encounter for screening mammogram for malignant neoplasm of breast: Secondary | ICD-10-CM | POA: Diagnosis not present

## 2023-10-27 DIAGNOSIS — Z1152 Encounter for screening for COVID-19: Secondary | ICD-10-CM | POA: Diagnosis not present

## 2023-10-27 DIAGNOSIS — B349 Viral infection, unspecified: Secondary | ICD-10-CM | POA: Diagnosis not present

## 2023-10-27 DIAGNOSIS — R0989 Other specified symptoms and signs involving the circulatory and respiratory systems: Secondary | ICD-10-CM | POA: Diagnosis not present

## 2023-10-27 DIAGNOSIS — R051 Acute cough: Secondary | ICD-10-CM | POA: Diagnosis not present

## 2023-12-22 DIAGNOSIS — H43813 Vitreous degeneration, bilateral: Secondary | ICD-10-CM | POA: Diagnosis not present

## 2023-12-22 DIAGNOSIS — H2513 Age-related nuclear cataract, bilateral: Secondary | ICD-10-CM | POA: Diagnosis not present

## 2023-12-22 DIAGNOSIS — H33311 Horseshoe tear of retina without detachment, right eye: Secondary | ICD-10-CM | POA: Diagnosis not present

## 2024-01-03 DIAGNOSIS — L821 Other seborrheic keratosis: Secondary | ICD-10-CM | POA: Diagnosis not present

## 2024-01-03 DIAGNOSIS — Z85828 Personal history of other malignant neoplasm of skin: Secondary | ICD-10-CM | POA: Diagnosis not present

## 2024-01-03 DIAGNOSIS — L570 Actinic keratosis: Secondary | ICD-10-CM | POA: Diagnosis not present

## 2024-01-23 ENCOUNTER — Ambulatory Visit (INDEPENDENT_AMBULATORY_CARE_PROVIDER_SITE_OTHER): Payer: Medicare Other | Admitting: Neurology

## 2024-01-23 ENCOUNTER — Encounter: Payer: Self-pay | Admitting: Neurology

## 2024-01-23 VITALS — BP 121/84 | HR 64 | Ht 63.25 in | Wt 106.0 lb

## 2024-01-23 DIAGNOSIS — R292 Abnormal reflex: Secondary | ICD-10-CM | POA: Diagnosis not present

## 2024-01-23 DIAGNOSIS — R200 Anesthesia of skin: Secondary | ICD-10-CM | POA: Diagnosis not present

## 2024-01-23 NOTE — Progress Notes (Signed)
 Follow-up Visit   Date: 01/23/2024    Madison [name] MRN: [id number] DOB: [birth year]    Madison [name] is a 78 y.o. right-handed Caucasian female with hyperlipidemia, osteoporosis, and MGUS returning to the clinic for follow-up of bilateral leg numbness.  The patient was accompanied to the clinic by self.  IMPRESSION/PLAN: Bilateral leg numbness/tingling, progressive over the past few months.  Symptoms were initially restricted to the feet and thought to be early neuropathy, however, now she has paresthesias involving the lower legs, which is atypical for neuropathy.  With her brisk patella reflexes, I will check MRI lumbar spine wo contrast to look for compressive pathology.  For her pain, start PT for low back strengthening and stretching   Further recommendations pending results.  --------------------------------------------- History of present illness: Starting around 2022, she began having numbness and tingling involving the soles of the feet.  Symptoms are constant and annoying.  There is no associated feet pain, imbalance, weakness.  She denies low back or radicular leg pain.     She drinks 2 glasses of wine nightly x many years.  A bottle generally lasts her two days.  Nonsmoker.    UPDATE 01/23/2024:  She is here due to worsening bilateral leg numbness.  She was last seen in July 2024 at which time her numbness was restricted to the feet.  Over the past several months, she has developed numbness/tingling involving the lower legs up to the knees.  She denies any weakness or imbalance.  She exercises regularly and walks unassisted.  Over the past 2 years, she has been more aware of left hip/buttocks pain, which is achy and worse with prolonged sitting.  No leg cramps.   Medications:  Current Outpatient Medications on File Prior to Visit  Medication Sig Dispense Refill   Ascorbic Acid (VITAMIN C) 1000 MG tablet Take 1,000 mg by mouth daily.     b complex vitamins tablet Take 1 tablet  by mouth daily.     Biotin 10 MG TABS Take by mouth.     Cholecalciferol (VITAMIN D PO) Take 2,000 Units by mouth. Reported on 12/22/2015     raloxifene  (EVISTA ) 60 MG tablet Take 1 tablet (60 mg total) by mouth daily. 30 tablet 12   SODIUM FLUORIDE 5000 SENSITIVE 1.1-5 % GEL Take by mouth.     Turmeric 400 MG CAPS take two capsules by mouth daily Oral     No current facility-administered medications on file prior to visit.    Allergies: No Known Allergies  Vital Signs:  BP 121/84   Pulse 64   Ht 5' 3.25" (1.607 m)   Wt 106 lb (48.1 kg)   SpO2 100%   BMI 18.63 kg/m   Neurological Exam: MENTAL STATUS including orientation to time, place, person, recent and remote memory, attention span and concentration, language, and fund of knowledge is normal.  Speech is not dysarthric.  CRANIAL NERVES:  Pupils equal round and reactive to light.  Normal conjugate, extra-ocular eye movements in all directions of gaze.  No ptosis.  Face is symmetric.   MOTOR:  Motor strength is 5/5 in all extremities.  No atrophy, fasciculations or abnormal movements.  No pronator drift.  Tone is normal.    MSRs:  Right        Left brachioradialis 2+  2+  biceps 2+  2+  triceps 2+  2+  patellar 3+  3+  ankle jerk 2+  2+  plantar response down  down   SENSORY:  Intact to vibration at the knees, reduced to 75% at the ankles and great toe.  Reduced temperature in the right lower legs (patchy), intact in the feet.  Pin prick is mildly reduced in the feet. No sensory ataxia.  COORDINATION/GAIT:  Normal finger-to- nose-finger.  Intact rapid alternating movements bilaterally.  Gait narrow based and stable.   Data:  Labs 08/03/2023:  vitamin B12 374, vitamim B1 16, folate 20, copper  121, SPEP with IFE IgG lambda monoclonal protein    Thank you for allowing me to participate in patient's care.  If I can answer any additional questions, I would be pleased to do so.     Sincerely,    Kimberle Stanfill K. Lydia Sams, DO

## 2024-01-31 DIAGNOSIS — M6281 Muscle weakness (generalized): Secondary | ICD-10-CM | POA: Diagnosis not present

## 2024-01-31 DIAGNOSIS — M545 Low back pain, unspecified: Secondary | ICD-10-CM | POA: Diagnosis not present

## 2024-02-04 ENCOUNTER — Ambulatory Visit
Admission: RE | Admit: 2024-02-04 | Discharge: 2024-02-04 | Disposition: A | Discharge status: Home / Self Care | Payer: Medicare Other | Source: Ambulatory Visit | Attending: Neurology | Admitting: Neurology

## 2024-02-04 DIAGNOSIS — R292 Abnormal reflex: Secondary | ICD-10-CM

## 2024-02-04 DIAGNOSIS — R200 Anesthesia of skin: Secondary | ICD-10-CM

## 2024-02-14 DIAGNOSIS — M6281 Muscle weakness (generalized): Secondary | ICD-10-CM | POA: Diagnosis not present

## 2024-02-14 DIAGNOSIS — M545 Low back pain, unspecified: Secondary | ICD-10-CM | POA: Diagnosis not present

## 2024-02-15 ENCOUNTER — Telehealth: Payer: Self-pay | Admitting: Neurology

## 2024-02-15 NOTE — Telephone Encounter (Signed)
 Pt cld for results of Feb MRI

## 2024-02-16 NOTE — Telephone Encounter (Signed)
 [facility] and have requested MRI Lumbar be read. They will get that done.

## 2024-02-21 ENCOUNTER — Other Ambulatory Visit: Payer: Self-pay

## 2024-02-21 DIAGNOSIS — G629 Polyneuropathy, unspecified: Secondary | ICD-10-CM

## 2024-02-21 DIAGNOSIS — R200 Anesthesia of skin: Secondary | ICD-10-CM

## 2024-02-21 DIAGNOSIS — R202 Paresthesia of skin: Secondary | ICD-10-CM

## 2024-02-28 DIAGNOSIS — M6281 Muscle weakness (generalized): Secondary | ICD-10-CM | POA: Diagnosis not present

## 2024-02-28 DIAGNOSIS — M545 Low back pain, unspecified: Secondary | ICD-10-CM | POA: Diagnosis not present

## 2024-03-13 DIAGNOSIS — M6281 Muscle weakness (generalized): Secondary | ICD-10-CM | POA: Diagnosis not present

## 2024-03-13 DIAGNOSIS — M545 Low back pain, unspecified: Secondary | ICD-10-CM | POA: Diagnosis not present

## 2024-03-16 ENCOUNTER — Ambulatory Visit (INDEPENDENT_AMBULATORY_CARE_PROVIDER_SITE_OTHER): Admitting: Neurology

## 2024-03-16 DIAGNOSIS — R202 Paresthesia of skin: Secondary | ICD-10-CM | POA: Diagnosis not present

## 2024-03-16 DIAGNOSIS — M5417 Radiculopathy, lumbosacral region: Secondary | ICD-10-CM

## 2024-03-16 DIAGNOSIS — R200 Anesthesia of skin: Secondary | ICD-10-CM

## 2024-03-16 DIAGNOSIS — G629 Polyneuropathy, unspecified: Secondary | ICD-10-CM

## 2024-03-16 NOTE — Procedures (Signed)
 [facility]  [address] [place], [address]  [place], [place] [address] Tel: [phone] Fax: [phone] Test Date:  03/16/2024  Patient: Madison [name] DOB: Mar 28, 1946 Physician: Nita Sickle, DO  Sex: Female Height: 5\' 3"  Ref Phys: Nita Sickle, DO  ID#: [id number]   Technician:    History: This is a 78 year old female referred for evaluation of bilateral leg paresthesias, worse on the left.  NCV & EMG Findings: Electrodiagnostic testing of the right lower extremity and additional studies of the left shows: Bilateral sural and superficial peroneal sensory responses are within normal limits. Bilateral peroneal and tibial motor responses are within normal limits. Bilateral tibial H reflex studies are within normal limits. Sparse chronic motor axonal loss changes are seen affecting the left S1 myotome, without accompanying active denervation.  These findings are not present in the right lower extremity   Impression: Chronic S1 radiculopathy affecting the left lower extremity, very mild. There is no evidence of a large fiber sensorimotor polyneuropathy affecting the lower extremities.   ___________________________ Nita Sickle, DO    Nerve Conduction Studies   Stim Site NR Peak (ms) Norm Peak (ms) O-P Amp (V) Norm O-P Amp  Left Sup Peroneal Anti Sensory (Ant Lat Mall)  32 C  12 cm    2.9 <4.6 10.9 >3  Right Sup Peroneal Anti Sensory (Ant Lat Mall)  32 C  12 cm    2.5 <4.6 7.1 >3  Left Sural Anti Sensory (Lat Mall)  32 C  Calf    3.6 <4.6 14.2 >3  Right Sural Anti Sensory (Lat Mall)  32 C  Calf    3.3 <4.6 11.4 >3     Stim Site NR Onset (ms) Norm Onset (ms) O-P Amp (mV) Norm O-P Amp Site1 Site2 Delta-0 (ms) Dist (cm) Vel (m/s) Norm Vel (m/s)  Left Peroneal Motor (Ext Dig Brev)  32 C  Ankle    3.8 <6.0 4.5 >2.5 B Fib Ankle 7.9 34.0 43 >40  B Fib    11.7  3.8  Poplt B Fib 1.7 8.0 47 >40  Poplt    13.4  3.6         Right Peroneal Motor (Ext Dig Brev)  32 C   Ankle    3.8 <6.0 3.5 >2.5 B Fib Ankle 8.7 35.0 40 >40  B Fib    12.5  3.1  Poplt B Fib 1.8 8.0 44 >40  Poplt    14.3  3.0         Left Tibial Motor (Abd Hall Brev)  32 C  Ankle    4.6 <6.0 9.9 >4 Knee Ankle 9.3 41.0 44 >40  Knee    13.9  7.6         Right Tibial Motor (Abd Hall Brev)  32 C  Ankle    4.1 <6.0 5.1 >4 Knee Ankle 9.8 41.0 42 >40  Knee    13.9  4.7          Electromyography   Side Muscle Ins.Act Fibs Fasc Recrt Amp Dur Poly Activation Comment  Right AntTibialis Nml Nml Nml Nml Nml Nml Nml Nml N/A  Right Gastroc Nml Nml Nml Nml Nml Nml Nml Nml N/A  Right Flex Dig Long Nml Nml Nml Nml Nml Nml Nml Nml N/A  Right RectFemoris Nml Nml Nml Nml Nml Nml Nml Nml N/A  Right BicepsFemS Nml Nml Nml Nml Nml Nml Nml Nml N/A  Right GluteusMed Nml Nml Nml Nml Nml Nml Nml Nml N/A  Left AntTibialis Nml Nml Nml Nml Nml Nml Nml Nml N/A  Left Gastroc Nml Nml Nml *1- *1+ *1+ *1+ Nml N/A  Left Flex Dig Long Nml Nml Nml Nml Nml Nml Nml Nml N/A  Left RectFemoris Nml Nml Nml Nml Nml Nml Nml Nml N/A  Left GluteusMed Nml Nml Nml Nml Nml Nml Nml Nml N/A  Left BicepsFemS Nml Nml Nml *1- *1+ *1+ *1+ Nml N/A      Waveforms:

## 2024-03-20 DIAGNOSIS — M545 Low back pain, unspecified: Secondary | ICD-10-CM | POA: Diagnosis not present

## 2024-03-20 DIAGNOSIS — M6281 Muscle weakness (generalized): Secondary | ICD-10-CM | POA: Diagnosis not present

## 2024-03-27 DIAGNOSIS — M6281 Muscle weakness (generalized): Secondary | ICD-10-CM | POA: Diagnosis not present

## 2024-03-27 DIAGNOSIS — M545 Low back pain, unspecified: Secondary | ICD-10-CM | POA: Diagnosis not present

## 2024-04-11 DIAGNOSIS — M6281 Muscle weakness (generalized): Secondary | ICD-10-CM | POA: Diagnosis not present

## 2024-04-11 DIAGNOSIS — M545 Low back pain, unspecified: Secondary | ICD-10-CM | POA: Diagnosis not present

## 2024-04-18 DIAGNOSIS — M6281 Muscle weakness (generalized): Secondary | ICD-10-CM | POA: Diagnosis not present

## 2024-04-18 DIAGNOSIS — M545 Low back pain, unspecified: Secondary | ICD-10-CM | POA: Diagnosis not present

## 2024-04-26 DIAGNOSIS — M6281 Muscle weakness (generalized): Secondary | ICD-10-CM | POA: Diagnosis not present

## 2024-04-26 DIAGNOSIS — M545 Low back pain, unspecified: Secondary | ICD-10-CM | POA: Diagnosis not present

## 2024-05-17 DIAGNOSIS — J019 Acute sinusitis, unspecified: Secondary | ICD-10-CM | POA: Diagnosis not present

## 2024-05-17 DIAGNOSIS — J069 Acute upper respiratory infection, unspecified: Secondary | ICD-10-CM | POA: Diagnosis not present

## 2024-05-21 DIAGNOSIS — E785 Hyperlipidemia, unspecified: Secondary | ICD-10-CM | POA: Diagnosis not present

## 2024-05-21 DIAGNOSIS — M8589 Other specified disorders of bone density and structure, multiple sites: Secondary | ICD-10-CM | POA: Diagnosis not present

## 2024-05-21 DIAGNOSIS — R946 Abnormal results of thyroid function studies: Secondary | ICD-10-CM | POA: Diagnosis not present

## 2024-05-21 DIAGNOSIS — R7301 Impaired fasting glucose: Secondary | ICD-10-CM | POA: Diagnosis not present

## 2024-05-21 DIAGNOSIS — Z79899 Other long term (current) drug therapy: Secondary | ICD-10-CM | POA: Diagnosis not present

## 2024-05-28 DIAGNOSIS — G609 Hereditary and idiopathic neuropathy, unspecified: Secondary | ICD-10-CM | POA: Diagnosis not present

## 2024-05-28 DIAGNOSIS — C4491 Basal cell carcinoma of skin, unspecified: Secondary | ICD-10-CM | POA: Diagnosis not present

## 2024-05-28 DIAGNOSIS — E785 Hyperlipidemia, unspecified: Secondary | ICD-10-CM | POA: Diagnosis not present

## 2024-05-28 DIAGNOSIS — Z0000 Encounter for general adult medical examination without abnormal findings: Secondary | ICD-10-CM | POA: Diagnosis not present

## 2024-05-28 DIAGNOSIS — G3184 Mild cognitive impairment, so stated: Secondary | ICD-10-CM | POA: Diagnosis not present

## 2024-05-28 DIAGNOSIS — D126 Benign neoplasm of colon, unspecified: Secondary | ICD-10-CM | POA: Diagnosis not present

## 2024-05-28 DIAGNOSIS — H9190 Unspecified hearing loss, unspecified ear: Secondary | ICD-10-CM | POA: Diagnosis not present

## 2024-05-28 DIAGNOSIS — R82998 Other abnormal findings in urine: Secondary | ICD-10-CM | POA: Diagnosis not present

## 2024-05-28 DIAGNOSIS — J302 Other seasonal allergic rhinitis: Secondary | ICD-10-CM | POA: Diagnosis not present

## 2024-05-28 DIAGNOSIS — M25569 Pain in unspecified knee: Secondary | ICD-10-CM | POA: Diagnosis not present

## 2024-05-28 DIAGNOSIS — M8580 Other specified disorders of bone density and structure, unspecified site: Secondary | ICD-10-CM | POA: Diagnosis not present

## 2024-05-28 DIAGNOSIS — R7301 Impaired fasting glucose: Secondary | ICD-10-CM | POA: Diagnosis not present

## 2024-05-28 DIAGNOSIS — I2510 Atherosclerotic heart disease of native coronary artery without angina pectoris: Secondary | ICD-10-CM | POA: Diagnosis not present

## 2024-07-02 ENCOUNTER — Ambulatory Visit: Payer: Medicare Other | Admitting: Neurology

## 2024-08-15 ENCOUNTER — Telehealth: Payer: Self-pay | Admitting: Neurology

## 2024-08-15 NOTE — Telephone Encounter (Signed)
 Pt called in this afternoon due to she is having dull pain in her right hip. She is having neuropathy pain going up both legs. They are staying numb too. Thanks

## 2024-08-16 NOTE — Telephone Encounter (Signed)
 I will need to evaluate her in the office for leg numbness.  For her hip pain, best that she see PCP/ortho.  OK to add on 9/23 + cancellation list.

## 2024-08-16 NOTE — Telephone Encounter (Signed)
 Called patient and informed her of Dr. Anthony recommendations. Patient would like to be worked in on 9/23 and is aware someone from the front will contact her to get her scheduled. Patient verbalized understanding and had no further questions or concerns.

## 2024-08-20 DIAGNOSIS — G609 Hereditary and idiopathic neuropathy, unspecified: Secondary | ICD-10-CM | POA: Diagnosis not present

## 2024-08-20 DIAGNOSIS — M25551 Pain in right hip: Secondary | ICD-10-CM | POA: Diagnosis not present

## 2024-08-20 DIAGNOSIS — G3184 Mild cognitive impairment, so stated: Secondary | ICD-10-CM | POA: Diagnosis not present

## 2024-08-20 DIAGNOSIS — M5441 Lumbago with sciatica, right side: Secondary | ICD-10-CM | POA: Diagnosis not present

## 2024-08-20 DIAGNOSIS — Z23 Encounter for immunization: Secondary | ICD-10-CM | POA: Diagnosis not present

## 2024-08-28 DIAGNOSIS — M25551 Pain in right hip: Secondary | ICD-10-CM | POA: Diagnosis not present

## 2024-09-04 ENCOUNTER — Ambulatory Visit (INDEPENDENT_AMBULATORY_CARE_PROVIDER_SITE_OTHER): Admitting: Neurology

## 2024-09-04 ENCOUNTER — Encounter: Payer: Self-pay | Admitting: Neurology

## 2024-09-04 VITALS — BP 126/70 | HR 64 | Ht 63.25 in | Wt 104.0 lb

## 2024-09-04 DIAGNOSIS — R200 Anesthesia of skin: Secondary | ICD-10-CM

## 2024-09-04 DIAGNOSIS — M5417 Radiculopathy, lumbosacral region: Secondary | ICD-10-CM | POA: Diagnosis not present

## 2024-09-04 MED ORDER — GABAPENTIN 100 MG PO CAPS: ORAL_CAPSULE | ORAL | 3 refills | Status: DC

## 2024-09-04 NOTE — Progress Notes (Signed)
 Follow-up Visit   Date: 09/04/2024    Madison [name] MRN: [id number] DOB: 04-18-1946    Madison [name] is a 78 y.o. right-handed Caucasian female with hyperlipidemia, osteoporosis, and MGUS returning to the clinic for follow-up of bilateral leg numbness.  The patient was accompanied to the clinic by self.  IMPRESSION/PLAN: Bilateral feet and lower extremity numbness, possible small fiber neuropathy and overlapping left S1 radiculopathy.  Prior NCS/EMG did not show evidence of neuropathy.  There is mild left S1 radiculopathy.  MRI lumbar spine shows left side disc protrusion at L5-S1 encroaching the foramina.  She has completed PT with mild benefit.   To further evaluate symptoms, I offered skin biopsy for small fiber neuropathy, however, it was declined as it would not change management.  Management otherwise remains supportive.   Start gabapentin  100mg  at bedtime x 2 weeks, then increase to 200mg  at bedtime  2.   Right posterior knee soft tissue mass, possible cyst Follow-up with PCP/ortho  Return to clinic in 6 months  --------------------------------------------- History of present illness: Starting around 2022, she began having numbness and tingling involving the soles of the feet.  Symptoms are constant and annoying.  There is no associated feet pain, imbalance, weakness.  She denies low back or radicular leg pain.     She drinks 2 glasses of wine nightly x many years.  A bottle generally lasts her two days.  Nonsmoker.    UPDATE 01/23/2024:  She is here due to worsening bilateral leg numbness.  She was last seen in July 2024 at which time her numbness was restricted to the feet.  Over the past several months, she has developed numbness/tingling involving the lower legs up to the knees.  She denies any weakness or imbalance.  She exercises regularly and walks unassisted.  Over the past 2 years, she has been more aware of left hip/buttocks pain, which is achy and worse with prolonged  sitting.  No leg cramps.   UPDATE 09/04/2024:  She is here because of ongoing tingling involving the toes and balls of the feet.  Nerve testing did not show large fiber neuropathy.  MRI lumbar spine showed left disc protrusion at L5-S1.  She completed PT with mild benefit.  She denies weakness, imbalance, or falls.  She has also noticed a nodule on the back of her right knee.  There is no pain or swelling.    Medications:  Current Outpatient Medications on File Prior to Visit  Medication Sig Dispense Refill   Ascorbic Acid (VITAMIN C) 1000 MG tablet Take 1,000 mg by mouth daily.     b complex vitamins tablet Take 1 tablet by mouth daily.     Biotin 10 MG TABS Take by mouth.     Cholecalciferol (VITAMIN D PO) Take 2,000 Units by mouth. Reported on 12/22/2015     escitalopram (LEXAPRO) 10 MG tablet 1 tablet Orally Once a day; Duration: 30 days     raloxifene  (EVISTA ) 60 MG tablet Take 1 tablet (60 mg total) by mouth daily. 30 tablet 12   rosuvastatin (CRESTOR) 5 MG tablet 1 tablet Orally Once a day; Duration: 90 days generic     SODIUM FLUORIDE 5000 SENSITIVE 1.1-5 % GEL Take by mouth.     Turmeric 400 MG CAPS take two capsules by mouth daily Oral     No current facility-administered medications on file prior to visit.    Allergies: No Known Allergies  Vital Signs:  BP 126/70  Pulse 64   Ht 5' 3.25 (1.607 m)   Wt 104 lb (47.2 kg)   SpO2 100%   BMI 18.28 kg/m   Neurological Exam: MENTAL STATUS including orientation to time, place, person, recent and remote memory, attention span and concentration, language, and fund of knowledge is normal.  Speech is not dysarthric.  CRANIAL NERVES:  Pupils equal round and reactive to light.  Normal conjugate, extra-ocular eye movements in all directions of gaze.  No ptosis.  Face is symmetric.   MOTOR:  Motor strength is 5/5 in all extremities.  No atrophy, fasciculations or abnormal movements.  No pronator drift.  Tone is normal.    MSRs:                                            Right        Left brachioradialis 2+  2+  biceps 2+  2+  triceps 2+  2+  patellar 3+  3+  ankle jerk 2+  2+  plantar response down  down   SENSORY:  Intact to vibration at the knees, reduced at the great toe bilaterally.  Temperature is reduced over the feet bilaterally. No sensory ataxia.  COORDINATION/GAIT:  Normal finger-to- nose-finger.  Intact rapid alternating movements bilaterally.  Gait narrow based and stable. Tandem gait intact.   Data:  Labs 08/03/2023:  vitamin B12 374, vitamim B1 16, folate 20, copper  121, SPEP with IFE IgG lambda monoclonal protein  MRI lumbar spine 02/16/2024: *Degenerative disc disease with a small left paracentral spondylitic disc herniation at L5-S1 flattening the thecal sac and encroaching the left neural foramina. *Circumferential bulging disc at L4-L5 with mild central canal stenosis aggravated by hypertrophic osteoarthritic changes of both facet joints and mild bilateral facet synovitis. *Small central, slightly right paracentral bulging disc at L1-L2 without foraminal narrowing.   NCS/EMG of the legs 03/16/2024: Chronic S1 radiculopathy affecting the left lower extremity, very mild. There is no evidence of a large fiber sensorimotor polyneuropathy affecting the lower extremities.   Thank you for allowing me to participate in patient's care.  If I can answer any additional questions, I would be pleased to do so.    Sincerely,    Anayah Arvanitis K. Tobie, DO

## 2024-09-04 NOTE — Patient Instructions (Addendum)
 Start gabapentin  100mg  at bedtime x 2 weeks, then increase to 200mg  at bedtime

## 2024-09-24 ENCOUNTER — Other Ambulatory Visit: Payer: Self-pay | Admitting: Internal Medicine

## 2024-09-24 DIAGNOSIS — Z1231 Encounter for screening mammogram for malignant neoplasm of breast: Secondary | ICD-10-CM

## 2024-10-25 ENCOUNTER — Ambulatory Visit
Admission: RE | Admit: 2024-10-25 | Discharge: 2024-10-25 | Disposition: A | Source: Ambulatory Visit | Attending: Internal Medicine | Admitting: Internal Medicine

## 2024-10-25 DIAGNOSIS — Z1231 Encounter for screening mammogram for malignant neoplasm of breast: Secondary | ICD-10-CM

## 2024-12-24 ENCOUNTER — Ambulatory Visit (INDEPENDENT_AMBULATORY_CARE_PROVIDER_SITE_OTHER): Admitting: Neurology

## 2024-12-24 ENCOUNTER — Encounter: Payer: Self-pay | Admitting: Neurology

## 2024-12-24 VITALS — BP 114/71 | HR 70 | Ht 63.25 in | Wt 106.0 lb

## 2024-12-24 DIAGNOSIS — G629 Polyneuropathy, unspecified: Secondary | ICD-10-CM

## 2024-12-24 MED ORDER — GABAPENTIN 100 MG PO CAPS: 100.0000 mg | ORAL_CAPSULE | Freq: Every day | ORAL | 3 refills | Status: AC

## 2024-12-24 NOTE — Progress Notes (Signed)
 "   Follow-up Visit   Date: 12/24/2024    Madison [name] MRN: [id number] DOB: [birth year]    Madison [name] is a 79 y.o. right-handed Caucasian female with hyperlipidemia, osteoporosis, and MGUS returning to the clinic for follow-up of bilateral leg numbness.  The patient was accompanied to the clinic by self.  IMPRESSION/PLAN: Bilateral feet and lower extremity numbness, possible small fiber neuropathy and overlapping left S1 radiculopathy.  Prior NCS/EMG did not show evidence of neuropathy.  There is mild left S1 radiculopathy.  MRI lumbar spine shows left side disc protrusion at L5-S1 encroaching the foramina.  She has completed PT with mild benefit.  Skin biopsy declined previously.  Management remains symptomatic.   Continue gabapentin  100mg  at bedtime (she did not tolerate higher dose) and symptoms are controlled.  Return to clinic in 6 months  --------------------------------------------- History of present illness: Starting around 2022, she began having numbness and tingling involving the soles of the feet.  Symptoms are constant and annoying.  There is no associated feet pain, imbalance, weakness.  She denies low back or radicular leg pain.     She drinks 2 glasses of wine nightly x many years.  A bottle generally lasts her two days.  Nonsmoker.    UPDATE 01/23/2024:  She is here due to worsening bilateral leg numbness.  She was last seen in July 2024 at which time her numbness was restricted to the feet.  Over the past several months, she has developed numbness/tingling involving the lower legs up to the knees.  She denies any weakness or imbalance.  She exercises regularly and walks unassisted.  Over the past 2 years, she has been more aware of left hip/buttocks pain, which is achy and worse with prolonged sitting.  No leg cramps.   UPDATE 09/04/2024:  She is here because of ongoing tingling involving the toes and balls of the feet.  Nerve testing did not show large fiber neuropathy.  MRI  lumbar spine showed left disc protrusion at L5-S1.  She completed PT with mild benefit.  She denies weakness, imbalance, or falls.  She has also noticed a nodule on the back of her right knee.  There is no pain or swelling.    UPDATE 12/24/2024:  She is here for follow-up visit.  She is taking gabapentin  100mg  at bedtime which helps some.  She did not tolerate gabapentin  200mg  at bedtime.  No imbalance.  No new complaints.    Medications:  Current Outpatient Medications on File Prior to Visit  Medication Sig Dispense Refill   Ascorbic Acid (VITAMIN C) 1000 MG tablet Take 1,000 mg by mouth daily.     b complex vitamins tablet Take 1 tablet by mouth daily.     Biotin 10 MG TABS Take by mouth.     Cholecalciferol (VITAMIN D PO) Take 2,000 Units by mouth. Reported on 12/22/2015     escitalopram (LEXAPRO) 10 MG tablet 1 tablet Orally Once a day; Duration: 30 days     gabapentin  (NEURONTIN ) 100 MG capsule Take 1 tablet at bedtime x 2 weeks, then increase 2 tablets at bedtime. 60 capsule 3   raloxifene  (EVISTA ) 60 MG tablet Take 1 tablet (60 mg total) by mouth daily. 30 tablet 12   rosuvastatin (CRESTOR) 5 MG tablet 1 tablet Orally Once a day; Duration: 90 days generic     SODIUM FLUORIDE 5000 SENSITIVE 1.1-5 % GEL Take by mouth.     Turmeric 400 MG CAPS take two capsules by  mouth daily Oral     No current facility-administered medications on file prior to visit.    Allergies: No Known Allergies  Vital Signs:  BP 114/71   Pulse 70   Ht 5' 3.25 (1.607 m)   Wt 106 lb (48.1 kg)   SpO2 98%   BMI 18.63 kg/m   Neurological Exam: MENTAL STATUS including orientation to time, place, person, recent and remote memory, attention span and concentration, language, and fund of knowledge is normal.  Speech is not dysarthric.  CRANIAL NERVES:  Normal conjugate, extra-ocular eye movements in all directions of gaze.  No ptosis.  Face is symmetric.   MOTOR:  Motor strength is 5/5 in all extremities.  No  atrophy, fasciculations or abnormal movements.  No pronator drift.  Tone is normal.    MSRs:                                           Right        Left brachioradialis 2+  2+  biceps 2+  2+  triceps 2+  2+  patellar 3+  3+  ankle jerk 2+  2+  plantar response down  down   SENSORY:  Intact to vibration throughout  COORDINATION/GAIT:   Gait narrow based and stable.    Data:  Labs 08/03/2023:  vitamin B12 374, vitamim B1 16, folate 20, copper  121, SPEP with IFE IgG lambda monoclonal protein  MRI lumbar spine 02/16/2024: *Degenerative disc disease with a small left paracentral spondylitic disc herniation at L5-S1 flattening the thecal sac and encroaching the left neural foramina. *Circumferential bulging disc at L4-L5 with mild central canal stenosis aggravated by hypertrophic osteoarthritic changes of both facet joints and mild bilateral facet synovitis. *Small central, slightly right paracentral bulging disc at L1-L2 without foraminal narrowing.   NCS/EMG of the legs 03/16/2024: Chronic S1 radiculopathy affecting the left lower extremity, very mild. There is no evidence of a large fiber sensorimotor polyneuropathy affecting the lower extremities.   Thank you for allowing me to participate in patient's care.  If I can answer any additional questions, I would be pleased to do so.    Sincerely,    Conroy Goracke K. Tobie, DO   "

## 2025-03-05 ENCOUNTER — Ambulatory Visit: Admitting: Neurology

## 2025-12-24 ENCOUNTER — Ambulatory Visit: Payer: Self-pay | Admitting: Neurology
